# Patient Record
Sex: Female | Born: 1945
Health system: Southern US, Community
[De-identification: ages and names within clinical notes are randomized; demographics above are authoritative.]

## PROBLEM LIST (undated history)

## (undated) DIAGNOSIS — K635 Polyp of colon: Secondary | ICD-10-CM

## (undated) DIAGNOSIS — E119 Type 2 diabetes mellitus without complications: Secondary | ICD-10-CM

## (undated) DIAGNOSIS — R42 Dizziness and giddiness: Secondary | ICD-10-CM

## (undated) DIAGNOSIS — I10 Essential (primary) hypertension: Secondary | ICD-10-CM

## (undated) DIAGNOSIS — I639 Cerebral infarction, unspecified: Secondary | ICD-10-CM

## (undated) DIAGNOSIS — D126 Benign neoplasm of colon, unspecified: Secondary | ICD-10-CM

## (undated) HISTORY — DX: Benign neoplasm of colon, unspecified: D12.6

## (undated) HISTORY — DX: Type 2 diabetes mellitus without complications: E11.9

## (undated) HISTORY — DX: Dizziness and giddiness: R42

## (undated) HISTORY — DX: Cerebral infarction, unspecified: I63.9

## (undated) HISTORY — DX: Polyp of colon: K63.5

---

## 2002-01-05 ENCOUNTER — Ambulatory Visit (HOSPITAL_COMMUNITY): Admission: RE | Admit: 2002-01-05 | Discharge: 2002-01-05 | Payer: Self-pay | Admitting: Family Medicine

## 2002-01-05 ENCOUNTER — Encounter: Payer: Self-pay | Admitting: Family Medicine

## 2003-01-05 ENCOUNTER — Ambulatory Visit (HOSPITAL_COMMUNITY): Admission: RE | Admit: 2003-01-05 | Discharge: 2003-01-05 | Payer: Self-pay | Admitting: Family Medicine

## 2003-01-05 ENCOUNTER — Encounter: Payer: Self-pay | Admitting: Family Medicine

## 2004-01-07 ENCOUNTER — Ambulatory Visit (HOSPITAL_COMMUNITY): Admission: RE | Admit: 2004-01-07 | Discharge: 2004-01-07 | Payer: Self-pay | Admitting: Family Medicine

## 2007-02-23 ENCOUNTER — Ambulatory Visit: Payer: Self-pay | Admitting: Internal Medicine

## 2007-02-23 ENCOUNTER — Ambulatory Visit (HOSPITAL_COMMUNITY): Admission: RE | Admit: 2007-02-23 | Discharge: 2007-02-23 | Payer: Self-pay | Admitting: Internal Medicine

## 2007-02-23 ENCOUNTER — Encounter (INDEPENDENT_AMBULATORY_CARE_PROVIDER_SITE_OTHER): Payer: Self-pay | Admitting: *Deleted

## 2007-02-23 DIAGNOSIS — D126 Benign neoplasm of colon, unspecified: Secondary | ICD-10-CM

## 2007-02-23 DIAGNOSIS — K635 Polyp of colon: Secondary | ICD-10-CM

## 2007-02-23 HISTORY — DX: Benign neoplasm of colon, unspecified: D12.6

## 2007-02-23 HISTORY — PX: COLONOSCOPY: SHX174

## 2007-02-23 HISTORY — DX: Polyp of colon: K63.5

## 2008-09-05 ENCOUNTER — Emergency Department (HOSPITAL_COMMUNITY): Admission: EM | Admit: 2008-09-05 | Discharge: 2008-09-06 | Payer: Self-pay | Admitting: Emergency Medicine

## 2008-09-24 ENCOUNTER — Ambulatory Visit (HOSPITAL_COMMUNITY): Admission: RE | Admit: 2008-09-24 | Discharge: 2008-09-24 | Payer: Self-pay | Admitting: Family Medicine

## 2009-01-20 ENCOUNTER — Emergency Department (HOSPITAL_COMMUNITY): Admission: EM | Admit: 2009-01-20 | Discharge: 2009-01-20 | Payer: Self-pay | Admitting: Emergency Medicine

## 2009-02-12 ENCOUNTER — Encounter: Payer: Self-pay | Admitting: Cardiology

## 2009-02-12 ENCOUNTER — Ambulatory Visit (HOSPITAL_COMMUNITY): Admission: RE | Admit: 2009-02-12 | Discharge: 2009-02-12 | Payer: Self-pay | Admitting: Cardiology

## 2009-02-12 ENCOUNTER — Ambulatory Visit: Payer: Self-pay | Admitting: Cardiology

## 2009-02-14 ENCOUNTER — Ambulatory Visit: Payer: Self-pay | Admitting: Cardiology

## 2010-12-06 ENCOUNTER — Encounter: Payer: Self-pay | Admitting: Family Medicine

## 2011-03-31 NOTE — Assessment & Plan Note (Signed)
[facility]                       [place] [facility]IOLOG[facility] OFFICE NOTE   NAME:[name], Grant                     MRN:          [id number]  DATE:02/12/2009                            DOB:          1946-02-10    CHIEF COMPLAINT:  I had a recent stroke.   HISTORY OF PRESENT ILLNESS:  [name] is a 65 year old married  white female, wife of patient of mine, who comes today referred by Dr.  Patrica Duel with a history of recent cerebellar stroke.   Per her history, on September 05, 2008, while she was cleaning out the  toilet, she got very dizzy and then subsequently had vomiting.  She went  to the emergency room and it was felt that she had an anxiety attack.   She continued to feel bad with dizziness and lightheadedness.  She went  to see Dr. Patrica Duel, who performed MRI of the head without contrast  on September 24, 2008.  This showed a late subacute infarction of left  superior cerebellar artery with no sign of hemorrhage or mass effect or  edema.  She had multiple small vessel changes in the cerebellar  hemisphere white matter.   She was then referred to [facility] to Dr. Felipe Drone, Neurology.  CT  angiography of the neck on October 03, 2008, showed basically no  significant obstructive disease in the vertebral arteries which were  both widely patent, there was no sign of dissection, and both vessels  were patent to the basilar artery.  Both common carotid arteries were  widely patent as well to the respective bifurcations.  Both cervical  internal carotid arteries also appeared normal.  Proximal anterior  circulation also was normal.  There was no comment made about the  cerebellum and no comment about the posterior cerebral circulation.   Her persistent symptoms now are left hand intention tremor.  She has a  little bit of a tremor at rest as well.  Her balance is improved.   Her cardiac and cerebrovascular risk factors include  hypertension and  borderline diabetes.  Her lipid status has been good according to her  and recent blood work from Dr. [name] office showed a total  cholesterol 102, HDL 54, and LDL 39.  I do not see whether she was on a  medication at that time.   At present, she has had no tachypalpitations though her heart rate tends  to run a little fast.  Previous EKG shows sinus tachycardia.  There is  no history of atrial arrhythmias.  She has had no syncope, dyspnea on  exertion, or angina.   PAST MEDICAL HISTORY:  Other than the above, she has had no  hospitalizations.  She is allergic to AMOXICILLIN.   CURRENT MEDICATIONS:  1. Lisinopril 5 mg a day.  2. Metformin extended release 500 mg a day.  3. Aspirin 325 mg a day.   SOCIAL HISTORY:  She is retired.  She is married, has no children.  Her  husband is with her today.   FAMILY HISTORY:  Remarkable for heart issues in her father, not  specified.  REVIEW OF SYSTEMS:  She wears eyeglasses.  She does not have dentures.  She has no other real positive review of systems other than the HPI.  All systems were questioned.   PHYSICAL EXAMINATION:  VITAL SIGNS:  Her blood pressure is 140/78 in the  right arm.  Her pulse is 100 and regular.  EKG confirms sinus  tachycardia.  Weight is 173.  HEENT:  Normal.  Facial symmetry is normal.  Teeth are in good shape.  Mucosa is normal.  NECK:  Supple.  Carotid upstrokes were equal bilaterally without bruits.  There is no JVD.  Thyroid is not enlarged.  Trachea is midline.  LUNGS:  Clear to auscultation and percussion.  HEART:  A nondisplaced PMI.  There is a regular rate and rhythm.  No  murmur, rub, or gallop.  ABDOMEN:  Soft, good bowel sounds.  No pulsatile mass.  No tenderness.  No organomegaly.  EXTREMITIES:  No cyanosis, clubbing, or edema.  Pulses are intact.  NEUROLOGIC:  Intact other than the intention tremor that is worse with  her palm faced to her nose with finger-to-nose.   EKG  shows a lot of tremor artifact, but basically shows normal sinus  rhythm with normal PR, QRS, and QTc interval.  She has somewhat low  voltage.   ASSESSMENT:  Recent left cerebellar stroke.  By CT angiography, there  has not been any major atherosclerotic disease detected other than some  small vessel changes.  Her vertebrobasilar system as well as her carotid  artery system seems to be intact.  There was no sign of dissection as  well that leaves embolism as a possible etiology.   There has been no history of any arrhythmias and certainly no symptoms  of such.  She does have a history of hypertension and probably has some  nonobstructive plaque in her cerebral circulation.  This could be the  cause of embolus as well.   PLAN:  1. Begin metoprolol 50 mg of the extended release p.o. daily to      decrease her heart rate and improve her blood pressure.  2. Continue aspirin and lisinopril.  3. Tight blood sugar control.  4. A 2-D echocardiogram.  If this shows any predisposition for atrial      fib or clot, we will perform a TEE.   Utility of placing a monitor at this point in time is pretty low.   I will see her back in 2 days after she has had her echocardiogram to  discuss further.     Thomas C. Daleen Squibb, MD, [facility]  Electronically Signed    TCW/MedQ  DD: 02/12/2009  DT: 02/13/2009  Job #: [id number]   cc:   Patrica Duel, M.D.

## 2011-03-31 NOTE — Assessment & Plan Note (Signed)
[facility]                       [place] CARDIOLOGY OFFICE NOTE   NAME:STANLEYMarlynn, Hinckley                     MRN:          [id number]  DATE:02/14/2009                            DOB:          [birth year]    [name] returns today for followup of her recent stroke.   Please see my extensive note from February 12, 2009.   A 2-D echocardiogram showed normal left ventricular function, EF of 60%,  no wall motion abnormalities, mild LVH.  No valvular heart disease.  No  obvious cardiac source of embolus as well as a normal left atrial size.   I have received the office notes now from Dr. Ninetta Lights.  He felt this was  ischemic stroke and secondary stroke prevention was indicated with  aspirin and blood pressure control.   She is tolerating the metoprolol 50 mg p.o. quite nicely.  She is on  aspirin 325 a day, metformin extended release 500 mg a day, and  lisinopril 5 mg a day.   Her blood pressure is 132/80, her pulse is 76 and regular.  Her weight  is 174.  The rest of her exam is unchanged.   ASSESSMENT AND PLAN:  I had a long talk with Mr. and Ms. Treese today.  At this point, aspirin 325 mg is indicated.  I told them that blood  pressure control and heart rate control were also essential.  If she  begins to have any significant tachy palpitations or palpitations in  general, she will let me know.  Otherwise, she will follow with Dr.  Nobie Putnam and Dr. Ninetta Lights.     Thomas C. Daleen Squibb, MD, [facility]  Electronically Signed    TCW/MedQ  DD: 02/14/2009  DT: 02/14/2009  Job #: [id number]   cc:   Felipe Drone, MD  Patrica Duel, M.D.

## 2011-04-03 NOTE — Op Note (Signed)
NAMESHERMAINE, Nina Grant              ACCOUNT NO.:  [id number]   MEDICAL RECORD NO.:  [id number]          PATIENT TYPE:  AMB   LOCATION:  DAY                           FACILITY:  [facility]   PHYSICIAN:  R. Roetta Sessions, M.D. DATE OF BIRTH:  03/29/1946   DATE OF PROCEDURE:  02/23/2007  DATE OF DISCHARGE:                               OPERATIVE REPORT   PROCEDURE:  Colonoscopy, biopsy.   INDICATIONS FOR PROCEDURE:  The patient is a 61-year lady with no lower  GI tract symptoms who has positive family history of colon cancer in a  sister who succumbed to the disease after being diagnosed at age 39.  [name] has never had her lower GI tract imaged.  Colonoscopy is now  being done as a high-risk screening maneuver.  This approach discussed  with the patient at length.  Potential risks, benefits and alternatives  have been reviewed, questions answered.  She is agreeable.  Please see  documentation in the medical record.   PROCEDURE NOTE:  O2 saturation, blood pressure, pulse, respirations were  monitored the entire procedure.   CONSCIOUS SEDATION:  Versed 4 mg IV, Demerol 100 grams IV in divided  doses.   INSTRUMENT:  Pentax video chip system.   FINDINGS:  Digital rectal exam revealed no abnormalities.   ENDOSCOPIC FINDINGS:  The prep was good.  Examination of colonic mucosa  was undertaken from the rectosigmoid junction and left, transverse,  right colon to area appendiceal orifice, ileocecal valve and cecum.  These structures well seen photographed for the record.  From this level  scope was withdrawn.  All previous mentioned mucosal surfaces were again  seen.  The patient had a 4 mm polyp in the mid descending colon and a  second similar sized polyp in the mid sigmoid.  Both of these polyps  were cold biopsy/removed.  The patient had left-sided diverticula,  remainder of colonic mucosa appeared normal.  The scope was pulled down  to the rectum where thorough examination the rectal  mucosa including  retroflex view of anal verge demonstrated only some anal papilla.  The  patient tolerated the procedure well was reacted endoscopy.   IMPRESSION:  1. Anal papilla, otherwise normal rectum.  2. Left-sided diverticula diminutive polyps in the mid descending and      sigmoid colon cold biopsy/removed.  Remainder of colonic appeared      normal.   RECOMMENDATIONS:  1. Diverticulosis and polyp literature provided to Ms. Serano  2. Follow-up on path.  3. Further recommendations to follow.      Jonathon Bellows, M.D.  Electronically Signed     RMR/MEDQ  D:  02/23/2007  T:  02/23/2007  Job:  [id number]   cc:   Patrica Duel, M.D.  Fax: [phone]

## 2011-08-18 LAB — URINALYSIS, ROUTINE W REFLEX MICROSCOPIC
Glucose, UA: NEGATIVE
Hgb urine dipstick: NEGATIVE
Protein, ur: NEGATIVE
Specific Gravity, Urine: >1.03 — ABNORMAL HIGH

## 2011-08-18 LAB — DIFFERENTIAL
Lymphocytes Relative: 10 — ABNORMAL LOW
Lymphs Abs: 0.9
Monocytes Relative: 3
Neutro Abs: 7.9 — ABNORMAL HIGH
Neutrophils Relative %: 87 — ABNORMAL HIGH

## 2011-08-18 LAB — CBC
Hemoglobin: 13.6
MCHC: 33.8
MCV: 91
RDW: 12.7

## 2011-08-18 LAB — COMPREHENSIVE METABOLIC PANEL
ALT: 20
BUN: 10
CO2: 25
Calcium: 8.9
Creatinine, Ser: 0.74
GFR calc non Af Amer: >60
Glucose, Bld: 206 — ABNORMAL HIGH
Sodium: 140
Total Protein: 6.3

## 2011-11-12 ENCOUNTER — Encounter: Payer: Self-pay | Admitting: Cardiology

## 2012-02-11 ENCOUNTER — Encounter: Payer: Self-pay | Admitting: Internal Medicine

## 2012-03-29 ENCOUNTER — Encounter: Payer: Self-pay | Admitting: Internal Medicine

## 2012-03-29 ENCOUNTER — Ambulatory Visit (INDEPENDENT_AMBULATORY_CARE_PROVIDER_SITE_OTHER): Payer: Medicare Other | Admitting: Internal Medicine

## 2012-03-29 VITALS — BP 136/72 | HR 71 | Temp 98.4°F | Ht 63.0 in | Wt 176.0 lb

## 2012-03-29 DIAGNOSIS — Z86010 Personal history of colonic polyps: Secondary | ICD-10-CM

## 2012-03-29 DIAGNOSIS — Z800 Family history of malignant neoplasm of digestive organs: Secondary | ICD-10-CM

## 2012-03-29 DIAGNOSIS — K635 Polyp of colon: Secondary | ICD-10-CM | POA: Insufficient documentation

## 2012-03-29 DIAGNOSIS — D126 Benign neoplasm of colon, unspecified: Secondary | ICD-10-CM

## 2012-03-29 NOTE — Assessment & Plan Note (Signed)
Pleasant 66 year old lady returns for scheduled for surveillance colonoscopy. Prior history of colonic adenoma and a positive family history of colon cancer in a first-degree relative. She's had an interim CVA with fairly good recovery.  Recommendations:The risks, benefits, limitations, alternatives and imponderables have been reviewed with the patient. Questions have been answered. All parties are agreeable.

## 2012-03-29 NOTE — Patient Instructions (Addendum)
Will schedule a colonoscopy to follow-up on polyps  Do not take your evening dose of metformin the night before the procedure

## 2012-03-29 NOTE — Progress Notes (Signed)
Primary Care Physician:  No primary provider on file. Primary Gastroenterologist:  Dr. Kortlyn Koltz  Pre-Procedure History & Physical: HPI:  Nina Grant is a 66 y.o. female here for consideration of setting up a surveillance colonoscopy. First colonoscopy in 2000 demonstrated diverticulosis and small adenomatous polyp. Positive family history of colon cancer and her sister. Currently does not have any bowel symptoms. Had a stroke in 2009. Residual mild weakness on the left side; she ambulates with a walker. No melena, rectal bleeding or other bowel symptoms. She is due for surveillance colonoscopy at this time. She has been found to be diabetic and hypertensive over the past few years.  Past Medical History  Diagnosis Date  . DM (diabetes mellitus)   . Vertigo   . Stroke   . Diverticula of colon   . Tubular adenoma of colon 02/23/2007  . Hyperplastic colon polyp 02/23/2007    Past Surgical History  Procedure Date  . Colonoscopy 02/23/2007    Dr. Marcanthony Sleight- tubular adenoma, hyperplastic polyp, diverticula    Prior to Admission medications   Medication Sig Start Date End Date Taking? Authorizing Provider  aspirin 325 MG tablet Take 325 mg by mouth daily.   Yes Historical Provider, MD  metFORMIN (GLUCOPHAGE-XR) 500 MG 24 hr tablet Take 500 mg by mouth at bedtime.  03/01/12  Yes Historical Provider, MD  metoprolol tartrate (LOPRESSOR) 25 MG tablet Take 25 mg by mouth daily.  03/01/12  Yes Historical Provider, MD    Allergies as of 03/29/2012  . (Not on File)    Family History  Problem Relation Age of Onset  . Colon cancer Sister 70    History   Social History  . Marital Status: Married    Spouse Name: N/A    Number of Children: N/A  . Years of Education: N/A   Occupational History  . Not on file.   Social History Main Topics  . Smoking status: Never Smoker   . Smokeless tobacco: Not on file  . Alcohol Use: No  . Drug Use: No  . Sexually Active: Not on file   Other Topics Concern   . Not on file   Social History Narrative  . No narrative on file    Review of Systems: See HPI, otherwise negative ROS  Physical Exam: BP 136/72  Pulse 71  Temp(Src) 98.4 F (36.9 C) (Temporal)  Ht 5' 3" (1.6 m)  Wt 176 lb (79.833 kg)  BMI 31.18 kg/m2 General:   Alert,  Well-developed, well-nourished, pleasant and cooperative in NAD.  We: Left sided ambulates with a cane. Patient examined in the chair because of patient convenience Skin:  Intact without significant lesions or rashes. Eyes:  Sclera clear, no icterus.   Conjunctiva pink. Ears:  Normal auditory acuity. Nose:  No deformity, discharge,  or lesions. Mouth:  No deformity or lesions. Neck:  Supple; no masses or thyromegaly. No significant cervical adenopathy. Lungs:  Clear throughout to auscultation.   No wheezes, crackles, or rhonchi. No acute distress. Heart:  Regular rate and rhythm; no murmurs, clicks, rubs,  or gallops. Abdomen: Non-distended, normal bowel sounds.  Soft and nontender without appreciable mass or hepatosplenomegaly.  Pulses:  Normal pulses noted. Extremities:  Without clubbing or edema.  Weak left side  

## 2012-03-29 NOTE — Progress Notes (Signed)
Primary Care Physician:  [facility] Primary Gastroenterologist:  Dr. Jena Gauss Pre-Procedure History & Physical: HPI:  Nina Grant is a 66 y.o. female here for   Past Medical History  Diagnosis Date  . DM (diabetes mellitus)   . Vertigo   . Stroke   . Diverticula of colon   . Tubular adenoma of colon 02/23/2007  . Hyperplastic colon polyp 02/23/2007    Past Surgical History  Procedure Date  . Colonoscopy 02/23/2007    Dr. Jena Gauss- tubular adenoma, hyperplastic polyp, diverticula    Prior to Admission medications   Medication Sig Start Date End Date Taking? Authorizing Provider  aspirin 325 MG tablet Take 325 mg by mouth daily.   Yes Historical Provider, MD  metFORMIN (GLUCOPHAGE-XR) 500 MG 24 hr tablet Take 500 mg by mouth at bedtime.  03/01/12  Yes Historical Provider, MD  metoprolol tartrate (LOPRESSOR) 25 MG tablet Take 25 mg by mouth daily.  03/01/12  Yes Historical Provider, MD    Allergies as of 03/29/2012  . (Not on File)    Family History  Problem Relation Age of Onset  . Colon cancer Sister 80    History   Social History  . Marital Status: Married    Spouse Name: N/A    Number of Children: N/A  . Years of Education: N/A   Occupational History  . Not on file.   Social History Main Topics  . Smoking status: Never Smoker   . Smokeless tobacco: Not on file  . Alcohol Use: No  . Drug Use: No  . Sexually Active: Not on file   Other Topics Concern  . Not on file   Social History Narrative  . No narrative on file    Review of Systems: See HPI, otherwise negative ROS  Physical Exam: BP 136/72  Pulse 71  Temp(Src) 98.4 F (36.9 C) (Temporal)  Ht 5\' 3"  (1.6 m)  Wt 176 lb (79.833 kg)  BMI 31.18 kg/m2 General:   Alert,  Well-developed, well-nourished, pleasant and cooperative in NAD Skin:  Intact without significant lesions or rashes. Eyes:  Sclera clear, no icterus.   Conjunctiva pink. Ears:  Normal auditory acuity. Nose:  No deformity, discharge,  or  lesions. Mouth:  No deformity or lesions. Neck:  Supple; no masses or thyromegaly. No significant cervical adenopathy. Lungs:  Clear throughout to auscultation.   No wheezes, crackles, or rhonchi. No acute distress. Heart:  Regular rate and rhythm; no murmurs, clicks, rubs,  or gallops. Abdomen: Non-distended, normal bowel sounds.  Soft and nontender without appreciable mass or hepatosplenomegaly.  Pulses:  Normal pulses noted. Extremities:  Without clubbing or edema.

## 2012-03-30 ENCOUNTER — Encounter: Payer: Self-pay | Admitting: Internal Medicine

## 2012-04-25 ENCOUNTER — Encounter (HOSPITAL_COMMUNITY): Payer: Self-pay | Admitting: Pharmacy Technician

## 2012-04-27 MED ORDER — SODIUM CHLORIDE 0.45 % IV SOLN
Freq: Once | INTRAVENOUS | Status: AC
  Administered 2012-04-28: 08:00:00 via INTRAVENOUS

## 2012-04-28 ENCOUNTER — Encounter (HOSPITAL_COMMUNITY): Payer: Self-pay | Admitting: *Deleted

## 2012-04-28 ENCOUNTER — Encounter (HOSPITAL_COMMUNITY)
Admission: RE | Disposition: A | Discharge status: Home / Self Care | Payer: Self-pay | Source: Ambulatory Visit | Attending: Internal Medicine

## 2012-04-28 ENCOUNTER — Ambulatory Visit (HOSPITAL_COMMUNITY)
Admission: RE | Admit: 2012-04-28 | Discharge: 2012-04-28 | Disposition: A | Discharge status: Home / Self Care | Payer: Medicare Other | Source: Ambulatory Visit | Attending: Internal Medicine | Admitting: Internal Medicine

## 2012-04-28 DIAGNOSIS — Z860100 Personal history of colon polyps, unspecified: Secondary | ICD-10-CM | POA: Insufficient documentation

## 2012-04-28 DIAGNOSIS — E119 Type 2 diabetes mellitus without complications: Secondary | ICD-10-CM | POA: Insufficient documentation

## 2012-04-28 DIAGNOSIS — Z01812 Encounter for preprocedural laboratory examination: Secondary | ICD-10-CM | POA: Insufficient documentation

## 2012-04-28 DIAGNOSIS — K5730 Diverticulosis of large intestine without perforation or abscess without bleeding: Secondary | ICD-10-CM

## 2012-04-28 DIAGNOSIS — Z09 Encounter for follow-up examination after completed treatment for conditions other than malignant neoplasm: Secondary | ICD-10-CM | POA: Insufficient documentation

## 2012-04-28 DIAGNOSIS — K635 Polyp of colon: Secondary | ICD-10-CM

## 2012-04-28 DIAGNOSIS — Z800 Family history of malignant neoplasm of digestive organs: Secondary | ICD-10-CM

## 2012-04-28 DIAGNOSIS — Z1211 Encounter for screening for malignant neoplasm of colon: Secondary | ICD-10-CM

## 2012-04-28 HISTORY — PX: COLONOSCOPY: SHX5424

## 2012-04-28 HISTORY — DX: Essential (primary) hypertension: I10

## 2012-04-28 SURGERY — COLONOSCOPY
Anesthesia: Moderate Sedation

## 2012-04-28 MED ORDER — MIDAZOLAM HCL 5 MG/5ML IJ SOLN
INTRAMUSCULAR | Status: AC
  Filled 2012-04-28: qty 10

## 2012-04-28 MED ORDER — MEPERIDINE HCL 100 MG/ML IJ SOLN
INTRAMUSCULAR | Status: AC
  Filled 2012-04-28: qty 2

## 2012-04-28 MED ORDER — ONDANSETRON HCL 4 MG/2ML IJ SOLN
INTRAMUSCULAR | Status: AC
  Administered 2012-04-28: 4 mg via INTRAVENOUS
  Filled 2012-04-28: qty 2

## 2012-04-28 MED ORDER — STERILE WATER FOR IRRIGATION IR SOLN
Status: DC | PRN
  Administered 2012-04-28: 08:00:00

## 2012-04-28 MED ORDER — MIDAZOLAM HCL 5 MG/5ML IJ SOLN
INTRAMUSCULAR | Status: DC | PRN
  Administered 2012-04-28 (×3): 1 mg via INTRAVENOUS
  Administered 2012-04-28: 2 mg via INTRAVENOUS

## 2012-04-28 MED ORDER — MEPERIDINE HCL 100 MG/ML IJ SOLN
INTRAMUSCULAR | Status: DC | PRN
  Administered 2012-04-28: 25 mg via INTRAVENOUS
  Administered 2012-04-28: 50 mg via INTRAVENOUS
  Administered 2012-04-28: 25 mg via INTRAVENOUS

## 2012-04-28 MED ORDER — ONDANSETRON HCL 4 MG/2ML IJ SOLN
4.0000 mg | Freq: Once | INTRAMUSCULAR | Status: AC
  Administered 2012-04-28: 4 mg via INTRAVENOUS

## 2012-04-28 NOTE — Discharge Instructions (Addendum)
Colonoscopy Discharge Instructions  Read the instructions outlined below and refer to this sheet in the next few weeks. These discharge instructions provide you with general information on caring for yourself after you leave the hospital. Your doctor may also give you specific instructions. While your treatment has been planned according to the most current medical practices available, unavoidable complications occasionally occur. If you have any problems or questions after discharge, call Dr. Jena Gauss at [phone]. ACTIVITY  You may resume your regular activity, but move at a slower pace for the next 24 hours.   Take frequent rest periods for the next 24 hours.   Walking will help get rid of the air and reduce the bloated feeling in your belly (abdomen).   No driving for 24 hours (because of the medicine (anesthesia) used during the test).    Do not sign any important legal documents or operate any machinery for 24 hours (because of the anesthesia used during the test).  NUTRITION  Drink plenty of fluids.   You may resume your normal diet as instructed by your doctor.   Begin with a light meal and progress to your normal diet. Heavy or fried foods are harder to digest and may make you feel sick to your stomach (nauseated).   Avoid alcoholic beverages for 24 hours or as instructed.  MEDICATIONS  You may resume your normal medications unless your doctor tells you otherwise.  WHAT YOU CAN EXPECT TODAY  Some feelings of bloating in the abdomen.   Passage of more gas than usual.   Spotting of blood in your stool or on the toilet paper.  IF YOU HAD POLYPS REMOVED DURING THE COLONOSCOPY:  No aspirin products for 7 days or as instructed.   No alcohol for 7 days or as instructed.   Eat a soft diet for the next 24 hours.  FINDING OUT THE RESULTS OF YOUR TEST Not all test results are available during your visit. If your test results are not back during the visit, make an appointment  with your caregiver to find out the results. Do not assume everything is normal if you have not heard from your caregiver or the medical facility. It is important for you to follow up on all of your test results.  SEEK IMMEDIATE MEDICAL ATTENTION IF:  You have more than a spotting of blood in your stool.   Your belly is swollen (abdominal distention).   You are nauseated or vomiting.   You have a temperature over 101.   You have abdominal pain or discomfort that is severe or gets worse throughout the day.      Diverticulosis information provided.  Repeat colonoscopy in 5 years.   Diverticulosis Diverticulosis is a common condition that develops when small pouches (diverticula) form in the wall of the colon. The risk of diverticulosis increases with age. It happens more often in people who eat a low-fiber diet. Most individuals with diverticulosis have no symptoms. Those individuals with symptoms usually experience abdominal pain, constipation, or loose stools (diarrhea). HOME CARE INSTRUCTIONS  Increase the amount of fiber in your diet as directed by your caregiver or dietician. This may reduce symptoms of diverticulosis.  Your caregiver may recommend taking a dietary fiber supplement.  Drink at least 6 to 8 glasses of water each day to prevent constipation.  Try not to strain when you have a bowel movement.  Your caregiver may recommend avoiding nuts and seeds to prevent complications, although this is still an uncertain benefit.  Only take over-the-counter or prescription medicines for pain, discomfort, or fever as directed by your caregiver.  FOODS WITH HIGH FIBER CONTENT INCLUDE: Fruits. Apple, peach, pear, tangerine, raisins, prunes.  Vegetables. Brussels sprouts, asparagus, broccoli, cabbage, carrot, cauliflower, romaine lettuce, spinach, summer squash, tomato, winter squash, zucchini.  Starchy Vegetables. Baked beans, kidney beans, lima beans, split peas, lentils, potatoes  (with skin).  Grains. Whole wheat bread, brown rice, bran flake cereal, plain oatmeal, white rice, shredded wheat, bran muffins.  SEEK IMMEDIATE MEDICAL CARE IF:  You develop increasing pain or severe bloating.  You have an oral temperature above 102 F (38.9 C), not controlled by medicine.  You develop vomiting or bowel movements that are bloody or black.  Document Released: 07/30/2004 Document Revised: 10/22/2011 Document Reviewed: 04/02/2010 [facility] Patient Information 2012 [place], [place].

## 2012-04-28 NOTE — Interval H&P Note (Signed)
History and Physical Interval Note:  04/28/2012 8:30 AM  Nina Grant  has presented today for surgery, with the diagnosis of hx of polyps  The various methods of treatment have been discussed with the patient and family. After consideration of risks, benefits and other options for treatment, the patient has consented to  Procedure(s) (LRB): COLONOSCOPY (N/A) as a surgical intervention .  The patients' history has been reviewed, patient examined, no change in status, stable for surgery.  I have reviewed the patients' chart and labs.  Questions were answered to the patient's satisfaction.     Eula Listen

## 2012-04-28 NOTE — H&P (View-Only) (Signed)
Primary Care Physician:  No primary provider on file. Primary Gastroenterologist:  Dr. Jena Gauss  Pre-Procedure History & Physical: HPI:  Nina Grant is a 66 y.o. female here for consideration of setting up a surveillance colonoscopy. First colonoscopy in 2000 demonstrated diverticulosis and small adenomatous polyp. Positive family history of colon cancer and her sister. Currently does not have any bowel symptoms. Had a stroke in 2009. Residual mild weakness on the left side; she ambulates with a walker. No melena, rectal bleeding or other bowel symptoms. She is due for surveillance colonoscopy at this time. She has been found to be diabetic and hypertensive over the past few years.  Past Medical History  Diagnosis Date  . DM (diabetes mellitus)   . Vertigo   . Stroke   . Diverticula of colon   . Tubular adenoma of colon 02/23/2007  . Hyperplastic colon polyp 02/23/2007    Past Surgical History  Procedure Date  . Colonoscopy 02/23/2007    Dr. Jena Gauss- tubular adenoma, hyperplastic polyp, diverticula    Prior to Admission medications   Medication Sig Start Date End Date Taking? Authorizing Provider  aspirin 325 MG tablet Take 325 mg by mouth daily.   Yes Historical Provider, MD  metFORMIN (GLUCOPHAGE-XR) 500 MG 24 hr tablet Take 500 mg by mouth at bedtime.  03/01/12  Yes Historical Provider, MD  metoprolol tartrate (LOPRESSOR) 25 MG tablet Take 25 mg by mouth daily.  03/01/12  Yes Historical Provider, MD    Allergies as of 03/29/2012  . (Not on File)    Family History  Problem Relation Age of Onset  . Colon cancer Sister 71    History   Social History  . Marital Status: Married    Spouse Name: N/A    Number of Children: N/A  . Years of Education: N/A   Occupational History  . Not on file.   Social History Main Topics  . Smoking status: Never Smoker   . Smokeless tobacco: Not on file  . Alcohol Use: No  . Drug Use: No  . Sexually Active: Not on file   Other Topics Concern   . Not on file   Social History Narrative  . No narrative on file    Review of Systems: See HPI, otherwise negative ROS  Physical Exam: BP 136/72  Pulse 71  Temp(Src) 98.4 F (36.9 C) (Temporal)  Ht 5\' 3"  (1.6 m)  Wt 176 lb (79.833 kg)  BMI 31.18 kg/m2 General:   Alert,  Well-developed, well-nourished, pleasant and cooperative in NAD.  We: Left sided ambulates with a cane. Patient examined in the chair because of patient convenience Skin:  Intact without significant lesions or rashes. Eyes:  Sclera clear, no icterus.   Conjunctiva pink. Ears:  Normal auditory acuity. Nose:  No deformity, discharge,  or lesions. Mouth:  No deformity or lesions. Neck:  Supple; no masses or thyromegaly. No significant cervical adenopathy. Lungs:  Clear throughout to auscultation.   No wheezes, crackles, or rhonchi. No acute distress. Heart:  Regular rate and rhythm; no murmurs, clicks, rubs,  or gallops. Abdomen: Non-distended, normal bowel sounds.  Soft and nontender without appreciable mass or hepatosplenomegaly.  Pulses:  Normal pulses noted. Extremities:  Without clubbing or edema.  Weak left side

## 2012-04-28 NOTE — Op Note (Signed)
[facility] [address] [place], [place]  [address]  COLONOSCOPY PROCEDURE REPORT  PATIENT:  Nina Grant, Nina Grant  MR#:  [id number] BIRTHDATE:  February 04, 1946, 66 yrs. old  GENDER:  female ENDOSCOPIST:  R. Roetta Sessions, MD FACP [facility] REF. BY:          self PROCEDURE DATE:  04/28/2012 PROCEDURE:  Surveillance colonoscopy  INDICATIONS:  History of colonic adenoma  INFORMED CONSENT:  The risks, benefits, alternatives and imponderables including but not limited to bleeding, perforation as well as the possibility of a missed lesion have been reviewed. The potential for biopsy, lesion removal, etc. have also been discussed.  Questions have been answered.  All parties agreeable. Please see the history and physical in the medical record for more information.  MEDICATIONS:  Versed 5 mg IV and Demerol 100 mg IV in divided doses.  DESCRIPTION OF PROCEDURE:  After a digital rectal exam was performed, the EC-[id number] ([id number]) colonoscope was advanced from the anus through the rectum and colon to the area of the cecum, ileocecal valve and appendiceal orifice.  The cecum was deeply intubated.  These structures were well-seen and photographed for the record.  From the level of the cecum and ileocecal valve, the scope was slowly and cautiously withdrawn.  The mucosal surfaces were carefully surveyed utilizing scope tip deflection to facilitate fold flattening as needed.  The scope was pulled down into the rectum where a thorough examination including retroflexion was performed. <<PROCEDUREIMAGES>>  FINDINGS: Adequate preparation. Anal papilla; otherwise, normal rectum. Scattered left-sided diverticula; remainder of colonic mucosa appeared                      normal.  THERAPEUTIC / DIAGNOSTIC MANEUVERS PERFORMED: None  COMPLICATIONS:  None  CECAL WITHDRAWAL TIME: 8 minutes  IMPRESSION: Colonic diverticulosis  RECOMMENDATIONS: Repeat surveillance colonoscopy in 5  years  ______________________________ R. Roetta Sessions, MD Caleen Essex  CC:  n. eSIGNED:   R. Roetta Sessions at 04/28/2012 09:06 AM  Marily Lente, [phone]

## 2012-05-03 ENCOUNTER — Encounter (HOSPITAL_COMMUNITY): Payer: Self-pay | Admitting: Internal Medicine

## 2012-12-27 ENCOUNTER — Other Ambulatory Visit (HOSPITAL_COMMUNITY): Payer: Self-pay | Admitting: Family Medicine

## 2012-12-27 DIAGNOSIS — Z139 Encounter for screening, unspecified: Secondary | ICD-10-CM

## 2013-01-02 ENCOUNTER — Ambulatory Visit (HOSPITAL_COMMUNITY)
Admission: RE | Admit: 2013-01-02 | Discharge: 2013-01-02 | Disposition: A | Discharge status: Home / Self Care | Payer: Medicare Other | Source: Ambulatory Visit | Attending: Family Medicine | Admitting: Family Medicine

## 2013-01-02 DIAGNOSIS — Z780 Asymptomatic menopausal state: Secondary | ICD-10-CM | POA: Insufficient documentation

## 2013-01-02 DIAGNOSIS — Z13820 Encounter for screening for osteoporosis: Secondary | ICD-10-CM | POA: Insufficient documentation

## 2013-01-02 DIAGNOSIS — Z139 Encounter for screening, unspecified: Secondary | ICD-10-CM

## 2013-04-12 ENCOUNTER — Other Ambulatory Visit (HOSPITAL_COMMUNITY): Payer: Self-pay | Admitting: Family Medicine

## 2013-04-12 DIAGNOSIS — Z139 Encounter for screening, unspecified: Secondary | ICD-10-CM

## 2013-04-18 ENCOUNTER — Ambulatory Visit (HOSPITAL_COMMUNITY)
Admission: RE | Admit: 2013-04-18 | Discharge: 2013-04-18 | Disposition: A | Discharge status: Home / Self Care | Payer: Medicare Other | Source: Ambulatory Visit | Attending: Family Medicine | Admitting: Family Medicine

## 2013-04-18 DIAGNOSIS — Z1231 Encounter for screening mammogram for malignant neoplasm of breast: Secondary | ICD-10-CM | POA: Insufficient documentation

## 2013-04-18 DIAGNOSIS — Z139 Encounter for screening, unspecified: Secondary | ICD-10-CM

## 2015-06-26 ENCOUNTER — Other Ambulatory Visit (HOSPITAL_COMMUNITY): Payer: Self-pay | Admitting: Internal Medicine

## 2015-06-26 DIAGNOSIS — Z1231 Encounter for screening mammogram for malignant neoplasm of breast: Secondary | ICD-10-CM

## 2015-07-03 ENCOUNTER — Ambulatory Visit (HOSPITAL_COMMUNITY): Payer: Self-pay

## 2015-07-03 ENCOUNTER — Ambulatory Visit (HOSPITAL_COMMUNITY)
Admission: RE | Admit: 2015-07-03 | Discharge: 2015-07-03 | Disposition: A | Discharge status: Home / Self Care | Payer: Medicare Other | Source: Ambulatory Visit | Attending: Internal Medicine | Admitting: Internal Medicine

## 2015-07-03 ENCOUNTER — Other Ambulatory Visit (HOSPITAL_COMMUNITY): Payer: Self-pay | Admitting: Internal Medicine

## 2015-07-03 DIAGNOSIS — Z1231 Encounter for screening mammogram for malignant neoplasm of breast: Secondary | ICD-10-CM | POA: Diagnosis not present

## 2016-10-23 ENCOUNTER — Ambulatory Visit (HOSPITAL_COMMUNITY)
Admission: RE | Admit: 2016-10-23 | Discharge: 2016-10-23 | Disposition: A | Discharge status: Home / Self Care | Payer: Medicare Other | Source: Ambulatory Visit | Attending: Family Medicine | Admitting: Family Medicine

## 2016-10-23 ENCOUNTER — Other Ambulatory Visit (HOSPITAL_COMMUNITY): Payer: Self-pay | Admitting: Family Medicine

## 2016-10-23 DIAGNOSIS — X58XXXA Exposure to other specified factors, initial encounter: Secondary | ICD-10-CM | POA: Insufficient documentation

## 2016-10-23 DIAGNOSIS — G8929 Other chronic pain: Secondary | ICD-10-CM

## 2016-10-23 DIAGNOSIS — M25512 Pain in left shoulder: Secondary | ICD-10-CM | POA: Diagnosis present

## 2016-10-23 DIAGNOSIS — S42255A Nondisplaced fracture of greater tuberosity of left humerus, initial encounter for closed fracture: Secondary | ICD-10-CM | POA: Insufficient documentation

## 2016-12-09 DIAGNOSIS — M25512 Pain in left shoulder: Secondary | ICD-10-CM | POA: Diagnosis not present

## 2016-12-14 DIAGNOSIS — E1129 Type 2 diabetes mellitus with other diabetic kidney complication: Secondary | ICD-10-CM | POA: Diagnosis not present

## 2016-12-14 DIAGNOSIS — J329 Chronic sinusitis, unspecified: Secondary | ICD-10-CM | POA: Diagnosis not present

## 2016-12-14 DIAGNOSIS — J042 Acute laryngotracheitis: Secondary | ICD-10-CM | POA: Diagnosis not present

## 2016-12-14 DIAGNOSIS — J9801 Acute bronchospasm: Secondary | ICD-10-CM | POA: Diagnosis not present

## 2016-12-14 DIAGNOSIS — Z23 Encounter for immunization: Secondary | ICD-10-CM | POA: Diagnosis not present

## 2016-12-14 DIAGNOSIS — E663 Overweight: Secondary | ICD-10-CM | POA: Diagnosis not present

## 2016-12-14 DIAGNOSIS — Z6829 Body mass index (BMI) 29.0-29.9, adult: Secondary | ICD-10-CM | POA: Diagnosis not present

## 2016-12-14 DIAGNOSIS — Z1389 Encounter for screening for other disorder: Secondary | ICD-10-CM | POA: Diagnosis not present

## 2016-12-17 ENCOUNTER — Ambulatory Visit (HOSPITAL_COMMUNITY): Payer: PPO | Attending: Orthopedic Surgery | Admitting: Occupational Therapy

## 2016-12-17 ENCOUNTER — Encounter (HOSPITAL_COMMUNITY): Payer: Self-pay | Admitting: Occupational Therapy

## 2016-12-17 DIAGNOSIS — M25612 Stiffness of left shoulder, not elsewhere classified: Secondary | ICD-10-CM | POA: Insufficient documentation

## 2016-12-17 DIAGNOSIS — M25512 Pain in left shoulder: Secondary | ICD-10-CM | POA: Insufficient documentation

## 2016-12-17 DIAGNOSIS — R29898 Other symptoms and signs involving the musculoskeletal system: Secondary | ICD-10-CM | POA: Insufficient documentation

## 2016-12-17 NOTE — Therapy (Signed)
[place], [place], [address] Phone: [phone]   Fax:  [phone]  Occupational Therapy Evaluation  Patient Details  Name: Nina Grant MRN: [id number] Date of Birth: [birth year] Referring Provider: Dr. Edmonia Lynch  Encounter Date: 12/17/2016      OT End of Session - 12/17/16 1452    Visit Number 1   Number of Visits 8   Date for OT Re-Evaluation 01/16/17   Authorization Type BCBS Medicare   Authorization Time Period Before 10th visit; $15 copay   Authorization - Visit Number 1   Authorization - Number of Visits 10   OT Start Time 1301   OT Stop Time 1336   OT Time Calculation (min) 35 min   Activity Tolerance Patient tolerated treatment well   Behavior During Therapy [facility] for tasks assessed/performed      Past Medical History:  Diagnosis Date  . Diverticula of colon   . DM (diabetes mellitus) ([place])   . Hyperplastic colon polyp 02/23/2007  . Hypertension   . Stroke ([place])   . Tubular adenoma of colon 02/23/2007  . Vertigo     Past Surgical History:  Procedure Laterality Date  . COLONOSCOPY  02/23/2007   Dr. Gala Romney- tubular adenoma, hyperplastic polyp, diverticula  . COLONOSCOPY  04/28/2012   Procedure: COLONOSCOPY;  Surgeon: Daneil Dolin, MD;  [facility];  Service: Endoscopy;  Laterality: N/A;  8:30    There were no vitals filed for this visit.      Subjective Assessment - 12/17/16 1453    Subjective  S: I only wear my sling occasionally now.    Pertinent History Pt is a 71 y/o female s/p left proximal humerus fracture on 10/22/16. Pt saw MD on 12/10/16 and has been released from sling and instructed to begin therapy. Pt was referred to occupational therapy for evaluation and treatment by Dr. Edmonia Lynch.    Limitations weakness of LUE s/p CVA   Patient Stated Goals To be able to use my left arm better.    Currently in Pain? No/denies           [facility] OT Assessment -  12/17/16 1302      Assessment   Diagnosis Left proximal humerus fracture   Referring Provider Dr. Edmonia Lynch   Onset Date 10/22/16   Prior Therapy None     Precautions   Precautions None   Precaution Comments Progress as tolerated     Restrictions   Weight Bearing Restrictions No     Balance Screen   Has the patient fallen in the past 6 months Yes   How many times? 1   Has the patient had a decrease in activity level because of a fear of falling?  No   Is the patient reluctant to leave their home because of a fear of falling?  No     Prior Function   Level of Independence Independent   Vocation Retired   Passenger transport manager, gardening and Medical laboratory scientific officer, painting     ADL   ADL comments Pt is having difficulty with dressing, washing and fixing hair, grooming tasks, reaching up into cabinets, lifting and carrying weighted objects, sweeping      Written Expression   Dominant Hand Left  until stroke, now uses right hand as dominant     Cognition   Overall Cognitive Status Within Functional Limits for tasks assessed     ROM / Strength   AROM / PROM /  Strength AROM;PROM;Strength     Palpation   Palpation comment Max fascial restrictions along left upper arm, trapezius, and scapularis regions     AROM   Overall AROM Comments Assessed seated, er/IR adducted   AROM Assessment Site Shoulder   Right/Left Shoulder Left   Left Shoulder Flexion 110 Degrees   Left Shoulder ABduction 150 Degrees   Left Shoulder Internal Rotation 90 Degrees   Left Shoulder External Rotation 41 Degrees     PROM   Overall PROM Comments Assessed supine, er/IR adducted   PROM Assessment Site Shoulder   Right/Left Shoulder Left   Left Shoulder Flexion 150 Degrees   Left Shoulder ABduction 180 Degrees   Left Shoulder Internal Rotation 90 Degrees   Left Shoulder External Rotation 55 Degrees     Strength   Overall Strength Comments Assessed seated, er/IR adducted   Strength Assessment Site Shoulder    Right/Left Shoulder Left   Left Shoulder Flexion 4-/5   Left Shoulder ABduction 4-/5   Left Shoulder Internal Rotation 4-/5   Left Shoulder External Rotation 4-/5                         OT Education - 12/17/16 1453    Education provided Yes   Education Details shoulder stretches   Person(s) Educated Patient   Methods Explanation;Demonstration;Handout   Comprehension Verbalized understanding;Returned demonstration          OT Short Term Goals - 12/17/16 1458      OT SHORT TERM GOAL #1   Title Pt will be provided with and educated on HEP to improve functional LUE use.    Time 4   Period Weeks   Status New     OT SHORT TERM GOAL #2   Title Pt will decrease pain in LUE to 2/10 or less to improve use of LUE as dominant during daily tasks.    Time 4   Period Weeks   Status New     OT SHORT TERM GOAL #3   Title Pt will decrease LUE fascial restrictions from max to mod amounts or less to improve mobility required for functional reaching tasks.    Time 4   Period Weeks   Status New     OT SHORT TERM GOAL #4   Title Pt will improve A/ROM of LUE to [facility] to improve ability to dry and fix her hair.    Time 4   Period Weeks   Status New     OT SHORT TERM GOAL #5   Title Pt will improve LUE strength to 4/5 or greater to increase ability to use LUE as dominant during gardening and canning tasks.    Time 4   Period Weeks   Status New                  Plan - 12/17/16 1455    Clinical Impression Statement A: Pt is a 71 y/o female s/p left proximal humerus fracture on 10/22/16 presenting with increased pain and fascial restrictions, decreased range of motion and strength of LUE limiting functional use. Pt provided with shoulder stretches for HEP this session.    Rehab Potential Good   OT Frequency 2x / week   OT Duration 4 weeks   OT Treatment/Interventions Self-care/ADL training;Therapeutic exercise;Patient/family education;Manual  Therapy;Ultrasound;Therapeutic activities;Electrical Stimulation;Cryotherapy;Moist Heat;Passive range of motion   Plan P: Pt will benefit from skilled OT services to decrease LUE pain and fascial restrictions, increase ROM, strength,  and functional use during daily tasks. Treatment plan: Myofascial release, manual therapy, P/ROM, A/ROM, general LUE strengthening, scapular stability and strengthening, modalities as needed   OT Home Exercise Plan 2/1: shoulder stretches   Consulted and Agree with Plan of Care Patient      Patient will benefit from skilled therapeutic intervention in order to improve the following deficits and impairments:  Impaired flexibility, Decreased strength, Decreased range of motion, Pain, Increased fascial restricitons, Impaired UE functional use  Visit Diagnosis: Acute pain of left shoulder  Stiffness of left shoulder, not elsewhere classified  Other symptoms and signs involving the musculoskeletal system      G-Codes - 2016-12-26 1501    Functional Assessment Tool Used clinical judgement   Functional Limitation Carrying, moving and handling objects   Carrying, Moving and Handling Objects Current Status [id number]) At least 40 percent but less than 60 percent impaired, limited or restricted   Carrying, Moving and Handling Objects Goal Status [id number]) At least 20 percent but less than 40 percent impaired, limited or restricted      Problem List Patient Active Problem List   Diagnosis Date Noted  . Colon polyps 03/29/2012  . Family history of colon cancer 03/29/2012   Guadelupe Sabin, OTR/L  [phone] 12-26-2016, 3:02 PM  [place], [place], [address] Phone: [phone]   Fax:  [phone]  Name: Nina Grant MRN: [id number] Date of Birth: 1946-11-08

## 2016-12-17 NOTE — Patient Instructions (Signed)
  1) Flexion Wall Stretch    Face wall, place affected handon wall in front of you. Slide hand up the wall  and lean body in towards the wall. Hold for 5 seconds. Repeat 3-5 times. 1-2 times/day.     2) Towel Stretch with Internal Rotation   Gently pull up your affected arm  behind your back with the assist of a towel. Repeat 3-5X, complete 1-2 times per day            3) Corner Stretch    Stand at a corner of a wall, place your arms on the walls with elbows bent. Lean into the corner until a stretch is felt along the front of your chest and/or shoulders. Hold for 5 seconds. Repeat 3-5X, 1-2 times/day.    4) Posterior Capsule Stretch    Bring the involved arm across chest. Grasp elbow and pull toward chest until you feel a stretch in the back of the upper arm and shoulder. Hold 5 seconds. Repeat 3-5X. Complete 1-2 times/day.    5) Scapular Retraction    Tuck chin back as you pinch shoulder blades together.  Hold 5 seconds. Repeat 3-5X. Complete 1-2 times/day.    6) External Rotation Stretch:     Place your affected hand on the wall with the elbow bent and gently turn your body the opposite direction until a stretch is felt. Repeat 3-5X, 1-2 times per day.

## 2016-12-21 ENCOUNTER — Encounter (HOSPITAL_COMMUNITY): Payer: Self-pay

## 2016-12-21 ENCOUNTER — Ambulatory Visit (HOSPITAL_COMMUNITY): Payer: PPO

## 2016-12-21 DIAGNOSIS — R29898 Other symptoms and signs involving the musculoskeletal system: Secondary | ICD-10-CM

## 2016-12-21 DIAGNOSIS — M25612 Stiffness of left shoulder, not elsewhere classified: Secondary | ICD-10-CM

## 2016-12-21 DIAGNOSIS — M25512 Pain in left shoulder: Secondary | ICD-10-CM | POA: Diagnosis not present

## 2016-12-21 NOTE — Therapy (Signed)
[place], [place], [address] Phone: [phone]   Fax:  [phone]  Occupational Therapy Treatment  Patient Details  Name: Nina Grant MRN: [id number] Date of Birth: Nov 06, 1946 Referring Provider: Dr. Edmonia Lynch  Encounter Date: 12/21/2016      OT End of Session - 12/21/16 1135    Visit Number 2   Number of Visits 8   Date for OT Re-Evaluation 01/16/17   Authorization Type BCBS Medicare   Authorization Time Period Before 10th visit; $15 copay   Authorization - Visit Number 2   Authorization - Number of Visits 10   OT Start Time 1035   OT Stop Time 1115   OT Time Calculation (min) 40 min   Activity Tolerance Patient tolerated treatment well   Behavior During Therapy [facility] for tasks assessed/performed      Past Medical History:  Diagnosis Date  . Diverticula of colon   . DM (diabetes mellitus) ([place])   . Hyperplastic colon polyp 02/23/2007  . Hypertension   . Stroke ([place])   . Tubular adenoma of colon 02/23/2007  . Vertigo     Past Surgical History:  Procedure Laterality Date  . COLONOSCOPY  02/23/2007   Dr. Gala Romney- tubular adenoma, hyperplastic polyp, diverticula  . COLONOSCOPY  04/28/2012   Procedure: COLONOSCOPY;  Surgeon: Daneil Dolin, MD;  [facility];  Service: Endoscopy;  Laterality: N/A;  8:30    There were no vitals filed for this visit.      Subjective Assessment - 12/21/16 1122    Subjective  S: I was actually able to hold the hair dryer this morning for the first time with the left arm.   Currently in Pain? No/denies            [facility] OT Assessment - 12/21/16 1123      Assessment   Diagnosis Left proximal humerus fracture     Precautions   Precautions None   Precaution Comments Progress as tolerated                  OT Treatments/Exercises (OP) - 12/21/16 1123      Exercises   Exercises Shoulder     Shoulder Exercises: Supine   Protraction  PROM;AROM;10 reps   Horizontal ABduction PROM;AROM;10 reps   External Rotation PROM;AROM;10 reps   Internal Rotation PROM;AROM;10 reps   Flexion PROM;AROM;10 reps   ABduction PROM;AROM;10 reps     Shoulder Exercises: Standing   Protraction AROM;10 reps   Horizontal ABduction AROM;10 reps   External Rotation AROM;10 reps   Internal Rotation AROM;10 reps   Flexion AROM;10 reps   ABduction AROM;10 reps   Extension Theraband;10 reps   Theraband Level (Shoulder Extension) Level 2 (Red)   Row Theraband;10 reps   Theraband Level (Shoulder Row) Level 2 (Red)   Retraction Theraband;10 reps   Theraband Level (Shoulder Retraction) Level 2 (Red)     Shoulder Exercises: ROM/Strengthening   UBE (Upper Arm Bike) Level 1 2' forward 2' reverse   Wall Wash 1'   Proximal Shoulder Strengthening, Supine 10X with VC for form. No rest breaks.     Manual Therapy   Manual Therapy Myofascial release   Manual therapy comments Manual therapy completed prior to exercises.   Myofascial Release Myofascial release and manual stretching completed to left upper arm, trapezius, and scapularis region to decrease fascial restrictions and increase joint mobility in a pain free zone.  OT Education - 12/21/16 1134    Education provided Yes   Education Details Pt was given OT evaluation. Reviewed goals and plan of care.   Person(s) Educated Patient   Methods Explanation;Handout   Comprehension Verbalized understanding          OT Short Term Goals - 12/21/16 1054      OT SHORT TERM GOAL #1   Title Pt will be provided with and educated on HEP to improve functional LUE use.    Time 4   Period Weeks   Status On-going     OT SHORT TERM GOAL #2   Title Pt will decrease pain in LUE to 2/10 or less to improve use of LUE as dominant during daily tasks.    Time 4   Period Weeks   Status On-going     OT SHORT TERM GOAL #3   Title Pt will decrease LUE fascial restrictions from max to mod  amounts or less to improve mobility required for functional reaching tasks.    Time 4   Period Weeks   Status On-going     OT SHORT TERM GOAL #4   Title Pt will improve A/ROM of LUE to [facility] to improve ability to dry and fix her hair.    Time 4   Period Weeks   Status On-going     OT SHORT TERM GOAL #5   Title Pt will improve LUE strength to 4/5 or greater to increase ability to use LUE as dominant during gardening and canning tasks.    Time 4   Period Weeks   Status On-going                  Plan - 12/21/16 1135    Clinical Impression Statement A: Initiated myofascial release, manual stretching, A/ROM, and scapular strengthening. Patient's P/ROM is almost completing WNL. During A/ROM exercises patient required max VC for form and technique. Patient frequently rushed through exercises when becoming fatigued.    Plan P: Continue to work on form and independence with exercises. Add overhead lacing, and sidelying exercises to increase scapular strength.      Patient will benefit from skilled therapeutic intervention in order to improve the following deficits and impairments:  Impaired flexibility, Decreased strength, Decreased range of motion, Pain, Increased fascial restricitons, Impaired UE functional use  Visit Diagnosis: Acute pain of left shoulder  Stiffness of left shoulder, not elsewhere classified  Other symptoms and signs involving the musculoskeletal system    Problem List Patient Active Problem List   Diagnosis Date Noted  . Colon polyps 03/29/2012  . Family history of colon cancer 03/29/2012   Ailene Ravel, OTR/L,CBIS  [phone]  12/21/2016, 11:38 AM  [employer] [address] [place], [place], [address] Phone: [phone]   Fax:  [phone]  Name: Nina Grant MRN: [id number] Date of Birth: 31-Jul-1946

## 2016-12-23 ENCOUNTER — Ambulatory Visit (HOSPITAL_COMMUNITY): Payer: PPO | Admitting: Occupational Therapy

## 2016-12-23 ENCOUNTER — Encounter (HOSPITAL_COMMUNITY): Payer: Self-pay | Admitting: Occupational Therapy

## 2016-12-23 DIAGNOSIS — M25512 Pain in left shoulder: Secondary | ICD-10-CM

## 2016-12-23 DIAGNOSIS — M25612 Stiffness of left shoulder, not elsewhere classified: Secondary | ICD-10-CM

## 2016-12-23 DIAGNOSIS — R29898 Other symptoms and signs involving the musculoskeletal system: Secondary | ICD-10-CM

## 2016-12-23 NOTE — Therapy (Signed)
[place], [place], [address] Phone: [phone]   Fax:  [phone]  Occupational Therapy Treatment  Patient Details  Name: Nina Grant MRN: [id number] Date of Birth: 03/23/1946 Referring Provider: Dr. Edmonia Lynch  Encounter Date: 12/23/2016      OT End of Session - 12/23/16 1024    Visit Number 3   Number of Visits 8   Date for OT Re-Evaluation 01/16/17   Authorization Type BCBS Medicare   Authorization Time Period Before 10th visit; $15 copay   Authorization - Visit Number 3   Authorization - Number of Visits 10   OT Start Time 0940   OT Stop Time 1022   OT Time Calculation (min) 42 min   Activity Tolerance Patient tolerated treatment well   Behavior During Therapy [facility] for tasks assessed/performed      Past Medical History:  Diagnosis Date  . Diverticula of colon   . DM (diabetes mellitus) ([place])   . Hyperplastic colon polyp 02/23/2007  . Hypertension   . Stroke ([place])   . Tubular adenoma of colon 02/23/2007  . Vertigo     Past Surgical History:  Procedure Laterality Date  . COLONOSCOPY  02/23/2007   Dr. Gala Romney- tubular adenoma, hyperplastic polyp, diverticula  . COLONOSCOPY  04/28/2012   Procedure: COLONOSCOPY;  Surgeon: Daneil Dolin, MD;  [facility];  Service: Endoscopy;  Laterality: N/A;  8:30    There were no vitals filed for this visit.      Subjective Assessment - 12/23/16 0939    Subjective  S: Last night was gthe first night I slept without the sling.    Currently in Pain? No/denies            [facility] OT Assessment - 12/23/16 0939      Assessment   Diagnosis Left proximal humerus fracture     Precautions   Precautions None   Precaution Comments Progress as tolerated                  OT Treatments/Exercises (OP) - 12/23/16 0944      Exercises   Exercises Shoulder     Shoulder Exercises: Supine   Protraction PROM;5 reps   Horizontal ABduction  PROM;5 reps   External Rotation PROM;5 reps   Internal Rotation PROM;5 reps   Flexion PROM;5 reps   ABduction PROM;5 reps     Shoulder Exercises: Sidelying   External Rotation AROM;10 reps   Internal Rotation AROM;10 reps   Flexion AROM;10 reps   ABduction AROM;10 reps   Other Sidelying Exercises protraction, A/ROM, 10X     Shoulder Exercises: Standing   Protraction AROM;10 reps   Horizontal ABduction AROM;10 reps   External Rotation AROM;10 reps   Internal Rotation AROM;10 reps   Flexion AROM;10 reps   ABduction AROM;10 reps   Extension Theraband;10 reps   Theraband Level (Shoulder Extension) Level 2 (Red)   Row Theraband;10 reps   Theraband Level (Shoulder Row) Level 2 (Red)   Retraction Theraband;10 reps   Theraband Level (Shoulder Retraction) Level 2 (Red)     Shoulder Exercises: ROM/Strengthening   UBE (Upper Arm Bike) Level 1 2' forward 2' reverse   Wall Wash 1'   Prot/Ret//Elev/Dep 1'     Manual Therapy   Manual Therapy Myofascial release   Manual therapy comments Manual therapy completed prior to exercises.   Myofascial Release Myofascial release and manual stretching completed to left upper arm, trapezius, and  scapularis region to decrease fascial restrictions and increase joint mobility in a pain free zone.                   OT Short Term Goals - 12/21/16 1054      OT SHORT TERM GOAL #1   Title Pt will be provided with and educated on HEP to improve functional LUE use.    Time 4   Period Weeks   Status On-going     OT SHORT TERM GOAL #2   Title Pt will decrease pain in LUE to 2/10 or less to improve use of LUE as dominant during daily tasks.    Time 4   Period Weeks   Status On-going     OT SHORT TERM GOAL #3   Title Pt will decrease LUE fascial restrictions from max to mod amounts or less to improve mobility required for functional reaching tasks.    Time 4   Period Weeks   Status On-going     OT SHORT TERM GOAL #4   Title Pt will  improve A/ROM of LUE to [facility] to improve ability to dry and fix her hair.    Time 4   Period Weeks   Status On-going     OT SHORT TERM GOAL #5   Title Pt will improve LUE strength to 4/5 or greater to increase ability to use LUE as dominant during gardening and canning tasks.    Time 4   Period Weeks   Status On-going                  Plan - 12/23/16 1024    Clinical Impression Statement A: Added sidelying A/ROM, overhead lacing, and prot/ret/elev/dep exercises this session. Pt requires intermittent verbal cuing for proper form during exercises, cuing for speed when fatigued. Pt reports she slept without her sling last night and woke up with her arm overhead and had no pain.    Plan P: Update HEP for A/ROM exercises, continue to work on proper form during exercises; add x to v arms if able to complete with good form.    OT Home Exercise Plan 2/1: shoulder stretches   Consulted and Agree with Plan of Care Patient      Patient will benefit from skilled therapeutic intervention in order to improve the following deficits and impairments:  Impaired flexibility, Decreased strength, Decreased range of motion, Pain, Increased fascial restricitons, Impaired UE functional use  Visit Diagnosis: Acute pain of left shoulder  Stiffness of left shoulder, not elsewhere classified  Other symptoms and signs involving the musculoskeletal system    Problem List Patient Active Problem List   Diagnosis Date Noted  . Colon polyps 03/29/2012  . Family history of colon cancer 03/29/2012   Guadelupe Sabin, OTR/L  [phone] 12/23/2016, 10:26 AM  [place] [address] [place], [place], [address] Phone: [phone]   Fax:  [phone]  Name: Nina Grant MRN: [id number] Date of Birth: 1946/09/27

## 2016-12-28 ENCOUNTER — Ambulatory Visit (HOSPITAL_COMMUNITY): Payer: PPO

## 2016-12-28 DIAGNOSIS — R29898 Other symptoms and signs involving the musculoskeletal system: Secondary | ICD-10-CM

## 2016-12-28 DIAGNOSIS — M25512 Pain in left shoulder: Secondary | ICD-10-CM

## 2016-12-28 DIAGNOSIS — M25612 Stiffness of left shoulder, not elsewhere classified: Secondary | ICD-10-CM

## 2016-12-28 NOTE — Therapy (Signed)
[place], [place], [address] Phone: [phone]   Fax:  [phone]  Occupational Therapy Treatment  Patient Details  Name: Nina Grant MRN: [id number] Date of Birth: 06-May-1946 Referring Provider: Dr. Edmonia Lynch  Encounter Date: 12/28/2016      OT End of Session - 12/28/16 1142    Visit Number 4   Number of Visits 8   Date for OT Re-Evaluation 01/16/17   Authorization Type BCBS Medicare   Authorization Time Period Before 10th visit; $15 copay   Authorization - Visit Number 4   Authorization - Number of Visits 10   OT Start Time [id number]   OT Stop Time 1115   OT Time Calculation (min) 40 min   Activity Tolerance Patient tolerated treatment well   Behavior During Therapy [facility] for tasks assessed/performed      Past Medical History:  Diagnosis Date  . Diverticula of colon   . DM (diabetes mellitus) ([place])   . Hyperplastic colon polyp 02/23/2007  . Hypertension   . Stroke ([place])   . Tubular adenoma of colon 02/23/2007  . Vertigo     Past Surgical History:  Procedure Laterality Date  . COLONOSCOPY  02/23/2007   Dr. Gala Romney- tubular adenoma, hyperplastic polyp, diverticula  . COLONOSCOPY  04/28/2012   Procedure: COLONOSCOPY;  Surgeon: Daneil Dolin, MD;  [facility];  Service: Endoscopy;  Laterality: N/A;  8:30    There were no vitals filed for this visit.                    OT Treatments/Exercises (OP) - 12/28/16 1057      Exercises   Exercises Shoulder     Shoulder Exercises: Standing   Protraction AROM;Strengthening;10 reps   Protraction Weight (lbs) 1   Horizontal ABduction AROM;Strengthening;10 reps   Horizontal ABduction Weight (lbs) 1   External Rotation Strengthening;10 reps   External Rotation Weight (lbs) 1   Internal Rotation Strengthening;10 reps   Internal Rotation Weight (lbs) 1   Flexion AROM;10 reps   ABduction AROM;10 reps   Extension Theraband;10  reps   Theraband Level (Shoulder Extension) Level 2 (Red)   Row Theraband;10 reps   Theraband Level (Shoulder Row) Level 2 (Red)   Retraction Theraband;10 reps   Theraband Level (Shoulder Retraction) Level 2 (Red)     Shoulder Exercises: ROM/Strengthening   UBE (Upper Arm Bike) Level 1 2' forward 2' reverse   Over Head Lace 1'   "W" Arms 10X   X to V Arms 10X   Proximal Shoulder Strengthening, Seated 10X no rest breaks     Manual Therapy   Manual Therapy Myofascial release   Manual therapy comments Manual therapy completed prior to exercises.   Myofascial Release Myofascial release and manual stretching completed to left upper arm, trapezius, and scapularis region to decrease fascial restrictions and increase joint mobility in a pain free zone.                 OT Education - 12/28/16 1056    Education provided Yes   Education Details A/ROM exercises   Person(s) Educated Patient   Methods Explanation;Handout;Demonstration;Verbal cues;Tactile cues   Comprehension Returned demonstration;Verbalized understanding          OT Short Term Goals - 12/21/16 1054      OT SHORT TERM GOAL #1   Title Pt will be provided with and educated on HEP to improve functional LUE use.  Time 4   Period Weeks   Status On-going     OT SHORT TERM GOAL #2   Title Pt will decrease pain in LUE to 2/10 or less to improve use of LUE as dominant during daily tasks.    Time 4   Period Weeks   Status On-going     OT SHORT TERM GOAL #3   Title Pt will decrease LUE fascial restrictions from max to mod amounts or less to improve mobility required for functional reaching tasks.    Time 4   Period Weeks   Status On-going     OT SHORT TERM GOAL #4   Title Pt will improve A/ROM of LUE to [facility] to improve ability to dry and fix her hair.    Time 4   Period Weeks   Status On-going     OT SHORT TERM GOAL #5   Title Pt will improve LUE strength to 4/5 or greater to increase ability to use LUE as  dominant during gardening and canning tasks.    Time 4   Period Weeks   Status On-going                  Plan - 12/28/16 1143    Clinical Impression Statement A: Added X tp V arms and W arms. Updated HEP to included shoulder A/ROM exercises. Pt continues to require VC to slow down movment and to focus on form. She was also able to use a low weight for a portion of her shoulder exercises this session. Requested to not complete sidelying exercises this session.   Plan P: Follow up onm updated HEP. Continue to work on form and progress to completing all A/ROM with 1# when able.      Patient will benefit from skilled therapeutic intervention in order to improve the following deficits and impairments:  Impaired flexibility, Decreased strength, Decreased range of motion, Pain, Increased fascial restricitons, Impaired UE functional use  Visit Diagnosis: Acute pain of left shoulder  Stiffness of left shoulder, not elsewhere classified  Other symptoms and signs involving the musculoskeletal system    Problem List Patient Active Problem List   Diagnosis Date Noted  . Colon polyps 03/29/2012  . Family history of colon cancer 03/29/2012    Ailene Ravel, OTR/L,CBIS  [phone]   12/28/2016, 11:47 AM  [name] [address] [place], [place], [address] Phone: [phone]   Fax:  [phone]  Name: Nina Grant MRN: [id number] Date of Birth: December 28, 1945

## 2016-12-28 NOTE — Patient Instructions (Signed)
*  Slow down. Don't throw your arm up.    1) Shoulder Protraction    Begin with elbows by your side, slowly "punch" straight out in front of you.      2) Shoulder Flexion  Standing:         Begin with arms at your side with thumbs pointed up, slowly raise both arms up and forward towards overhead.        3) Horizontal abduction/adduction   Standing:           Begin with arms straight out in front of you, bring out to the side in at "T" shape. Keep arms straight entire time.      4) Internal & External Rotation    *No band* -Stand with elbows at the side and elbows bent 90 degrees. Move your forearms away from your body, then bring back inward toward the body.     5) Shoulder Abduction  Standing:       Lying on your back begin with your arms flat on the table next to your side. Slowly move your arms out to the side so that they go overhead, in a jumping jack or snow angel movement.    6) X to V arms (cheerleader move):  Begin with arms straight down, crossed in front of body in an "X". Keeping arms crossed, lift arms straight up overhead. Then spread arms apart into a "V" shape.  Bring back together into x and lower down to starting position.    7) W arms:  Begin with elbows bent and even with shoulders, hands pointing to ceiling. Keeping elbows at shoulder level, 1-shrug shoulders up, 2-squeeze shoulder blades together, and 3-relax.    Repeat all exercises 10-15 times, 1-2 times per day.

## 2016-12-30 ENCOUNTER — Encounter (HOSPITAL_COMMUNITY): Payer: Self-pay

## 2016-12-30 ENCOUNTER — Ambulatory Visit (HOSPITAL_COMMUNITY): Payer: PPO

## 2016-12-30 DIAGNOSIS — M25612 Stiffness of left shoulder, not elsewhere classified: Secondary | ICD-10-CM

## 2016-12-30 DIAGNOSIS — R29898 Other symptoms and signs involving the musculoskeletal system: Secondary | ICD-10-CM

## 2016-12-30 DIAGNOSIS — M25512 Pain in left shoulder: Secondary | ICD-10-CM | POA: Diagnosis not present

## 2016-12-30 NOTE — Therapy (Signed)
[place], [place], [address] Phone: [phone]   Fax:  [phone]  Occupational Therapy Treatment  Patient Details  Name: Nina Grant MRN: [id number] Date of Birth: 10/09/1946 Referring Provider: Dr. Edmonia Lynch  Encounter Date: 12/30/2016      OT End of Session - 12/30/16 1114    Visit Number 5   Number of Visits 8   Date for OT Re-Evaluation 01/16/17   Authorization Type BCBS Medicare   Authorization Time Period Before 10th visit; $15 copay   Authorization - Visit Number 5   Authorization - Number of Visits 10   OT Start Time 1030   OT Stop Time 1115   OT Time Calculation (min) 45 min   Activity Tolerance Patient tolerated treatment well   Behavior During Therapy [facility] for tasks assessed/performed      Past Medical History:  Diagnosis Date  . Diverticula of colon   . DM (diabetes mellitus) ([place])   . Hyperplastic colon polyp 02/23/2007  . Hypertension   . Stroke ([place])   . Tubular adenoma of colon 02/23/2007  . Vertigo     Past Surgical History:  Procedure Laterality Date  . COLONOSCOPY  02/23/2007   Dr. Gala Romney- tubular adenoma, hyperplastic polyp, diverticula  . COLONOSCOPY  04/28/2012   Procedure: COLONOSCOPY;  Surgeon: Daneil Dolin, MD;  [facility];  Service: Endoscopy;  Laterality: N/A;  8:30    There were no vitals filed for this visit.      Subjective Assessment - 12/30/16 1032    Subjective  S: No issues to report.   Currently in Pain? No/denies            [facility] OT Assessment - 12/30/16 1046      Assessment   Diagnosis Left proximal humerus fracture     Precautions   Precautions None   Precaution Comments Progress as tolerated                  OT Treatments/Exercises (OP) - 12/30/16 1046      Exercises   Exercises Shoulder     Shoulder Exercises: Supine   Protraction PROM;5 reps;Strengthening;12 reps   Protraction Limitations 1   Horizontal ABduction PROM;5 reps;Strengthening;12 reps   Horizontal ABduction Weight (lbs) 1   External Rotation PROM;5 reps;Strengthening;12 reps   External Rotation Weight (lbs) 1   Internal Rotation PROM;5 reps;Strengthening;12 reps   Internal Rotation Weight (lbs) 1   Flexion PROM;5 reps;Strengthening;12 reps   Shoulder Flexion Weight (lbs) 1   ABduction PROM;5 reps;Strengthening;12 reps   Shoulder ABduction Weight (lbs) 1     Shoulder Exercises: Standing   Protraction Strengthening;10 reps   Protraction Weight (lbs) 1   Horizontal ABduction Strengthening;10 reps   Horizontal ABduction Weight (lbs) 1   External Rotation Strengthening;10 reps   External Rotation Weight (lbs) 1   Internal Rotation Strengthening;10 reps   Internal Rotation Weight (lbs) 1   Flexion Strengthening;10 reps   Shoulder Flexion Weight (lbs) 1   ABduction Strengthening;10 reps   Shoulder ABduction Weight (lbs) 1     Shoulder Exercises: ROM/Strengthening   UBE (Upper Arm Bike) Level 1 2' forward 2' reverse   Over Head Lace 2'   X to V Arms 10X  utilized floor floor     Manual Therapy   Manual Therapy Myofascial release   Manual therapy comments Manual therapy completed prior to exercises.   Myofascial Release Myofascial release and manual stretching  completed to left upper arm, trapezius, and scapularis region to decrease fascial restrictions and increase joint mobility in a pain free zone.                 OT Education - 12/30/16 1111    Education provided Yes   Education Details Pt instructed that she could start to use a small 1# weight or hold a water bottle/can of soup   Person(s) Educated Patient   Methods Explanation   Comprehension Verbalized understanding          OT Short Term Goals - 12/21/16 1054      OT SHORT TERM GOAL #1   Title Pt will be provided with and educated on HEP to improve functional LUE use.    Time 4   Period Weeks   Status On-going     OT SHORT TERM  GOAL #2   Title Pt will decrease pain in LUE to 2/10 or less to improve use of LUE as dominant during daily tasks.    Time 4   Period Weeks   Status On-going     OT SHORT TERM GOAL #3   Title Pt will decrease LUE fascial restrictions from max to mod amounts or less to improve mobility required for functional reaching tasks.    Time 4   Period Weeks   Status On-going     OT SHORT TERM GOAL #4   Title Pt will improve A/ROM of LUE to [facility] to improve ability to dry and fix her hair.    Time 4   Period Weeks   Status On-going     OT SHORT TERM GOAL #5   Title Pt will improve LUE strength to 4/5 or greater to increase ability to use LUE as dominant during gardening and canning tasks.    Time 4   Period Weeks   Status On-going                  Plan - 12/30/16 1115    Clinical Impression Statement A: utilized floor mirror for proper form during X to V arms although it did not benefit the patient as much as I hoped. She continued to have difficulty with carry with education regarding form.    Plan P: Measure for MD appointment.      Patient will benefit from skilled therapeutic intervention in order to improve the following deficits and impairments:  Impaired flexibility, Decreased strength, Decreased range of motion, Pain, Increased fascial restricitons, Impaired UE functional use  Visit Diagnosis: Acute pain of left shoulder  Stiffness of left shoulder, not elsewhere classified  Other symptoms and signs involving the musculoskeletal system    Problem List Patient Active Problem List   Diagnosis Date Noted  . Colon polyps 03/29/2012  . Family history of colon cancer 03/29/2012   Ailene Ravel, OTR/L,CBIS  [phone]  12/30/2016, 11:16 AM  [facility] [address] [place], [place], [address] Phone: [phone]   Fax:  [phone]  Name: Nina Grant MRN: [id number] Date of Birth: 1946/08/07

## 2017-01-04 ENCOUNTER — Ambulatory Visit (HOSPITAL_COMMUNITY): Payer: PPO

## 2017-01-04 ENCOUNTER — Encounter (HOSPITAL_COMMUNITY): Payer: Self-pay

## 2017-01-04 DIAGNOSIS — R29898 Other symptoms and signs involving the musculoskeletal system: Secondary | ICD-10-CM

## 2017-01-04 DIAGNOSIS — M25612 Stiffness of left shoulder, not elsewhere classified: Secondary | ICD-10-CM

## 2017-01-04 DIAGNOSIS — M25512 Pain in left shoulder: Secondary | ICD-10-CM

## 2017-01-04 NOTE — Patient Instructions (Signed)
Complete 10-15 repetitions. 1 time a day.  ELASTIC BAND SHOULDER EXTERNAL ROTATION - ER  While holding an elastic band at your side with your elbow bent, start with your hand near your stomach and then pull the band away. Keep your elbow at your side the entire time.     (Home) Extension: Isometric / Bilateral Arm Retraction - Sitting   Facing anchor, hold hands and elbow at shoulder height, with elbow bent.  Pull arms back to squeeze shoulder blades together. Repeat 10-15 times.  Copyright  VHI. All rights reserved.   (Home) Retraction: Row - Bilateral (Anchor)   Facing anchor, arms reaching forward, pull hands toward stomach, keeping elbows bent and at your sides and pinching shoulder blades together. Repeat 10-15 times.  Copyright  VHI. All rights reserved.   (Clinic) Extension / Flexion (Assist)   Face anchor, pull arms back, keeping elbow straight, and squeze shoulder blades together. Repeat 10-15 times.   Copyright  VHI. All rights reserved.

## 2017-01-04 NOTE — Therapy (Signed)
[place], [place], [address] Phone: [phone]   Fax:  [phone]  Occupational Therapy Treatment  Patient Details  Name: Nina Grant MRN: [id number] Date of Birth: 09/12/1946 Referring Provider: Dr. Edmonia Lynch  Encounter Date: 01/04/2017      OT End of Session - 01/04/17 1139    Visit Number 6   Number of Visits 8   Date for OT Re-Evaluation 01/16/17   Authorization Type BCBS Medicare   Authorization Time Period Before 10th visit; $15 copay   Authorization - Visit Number 5   Authorization - Number of Visits 10   OT Start Time 9169  discharge today   OT Stop Time 1115   OT Time Calculation (min) 40 min   Activity Tolerance Patient tolerated treatment well   Behavior During Therapy [facility] for tasks assessed/performed      Past Medical History:  Diagnosis Date  . Diverticula of colon   . DM (diabetes mellitus) ([place])   . Hyperplastic colon polyp 02/23/2007  . Hypertension   . Stroke ([place])   . Tubular adenoma of colon 02/23/2007  . Vertigo     Past Surgical History:  Procedure Laterality Date  . COLONOSCOPY  02/23/2007   Dr. Gala Romney- tubular adenoma, hyperplastic polyp, diverticula  . COLONOSCOPY  04/28/2012   Procedure: COLONOSCOPY;  Surgeon: Daneil Dolin, MD;  [facility];  Service: Endoscopy;  Laterality: N/A;  8:30    There were no vitals filed for this visit.      Subjective Assessment - 01/04/17 1137    Subjective  S: I don't have any pain when I'm using it at home.    Currently in Pain? No/denies            [facility] OT Assessment - 01/04/17 1035      Assessment   Diagnosis Left proximal humerus fracture   Onset Date 10/22/16     Precautions   Precautions None   Precaution Comments Progress as tolerated     Prior Function   Level of Independence Independent     AROM   Overall AROM Comments Assessed seated, er/IR adducted   AROM Assessment Site Shoulder   Right/Left Shoulder Left   Left Shoulder Flexion 145 Degrees  previous: 110   Left Shoulder ABduction 150 Degrees  previous:  same   Left Shoulder Internal Rotation 90 Degrees  previuos: same   Left Shoulder External Rotation 67 Degrees  previous: 41     PROM   Overall PROM Comments Assessed supine, er/IR adducted   PROM Assessment Site Shoulder   Right/Left Shoulder Left   Left Shoulder Flexion 156 Degrees  previous: 150   Left Shoulder ABduction 180 Degrees  previous: same   Left Shoulder Internal Rotation 90 Degrees  previous: same   Left Shoulder External Rotation 65 Degrees  previous: 55     Strength   Overall Strength Comments Assessed seated, er/IR adducted   Strength Assessment Site Shoulder   Right/Left Shoulder Left   Left Shoulder Flexion 5/5  previous; 4-/5   Left Shoulder ABduction 5/5  previous: 4-/5   Left Shoulder Internal Rotation 5/5  previous: 4-/5   Left Shoulder External Rotation 4-/5  previous: 4-/5                  OT Treatments/Exercises (OP) - 01/04/17 1137      Exercises   Exercises Shoulder     Shoulder Exercises: Supine  Protraction PROM;5 reps   Horizontal ABduction PROM;5 reps   External Rotation PROM;5 reps   Internal Rotation PROM;5 reps   Flexion PROM;5 reps   ABduction PROM;5 reps     Shoulder Exercises: Standing   External Rotation Theraband;10 reps   Theraband Level (Shoulder External Rotation) Level 2 (Red)   Extension Theraband;10 reps   Theraband Level (Shoulder Extension) Level 2 (Red)   Row Theraband;10 reps   Retraction Theraband;10 reps   Theraband Level (Shoulder Retraction) Level 2 (Red)     Manual Therapy   Manual Therapy Myofascial release   Manual therapy comments Manual therapy completed prior to exercises.   Myofascial Release Myofascial release and manual stretching completed to left upper arm, trapezius, and scapularis region to decrease fascial restrictions and increase joint mobility in a  pain free zone.                 OT Education - 2017/01/13 1139    Education provided Yes   Education Details Reviewed goals. HEP updated and reviewed.    Person(s) Educated Patient   Methods Explanation;Demonstration;Verbal cues;Handout   Comprehension Verbalized understanding;Returned demonstration          OT Short Term Goals - Jan 13, 2017 1050      OT SHORT TERM GOAL #1   Title Pt will be provided with and educated on HEP to improve functional LUE use.    Time 4   Period Weeks   Status Achieved     OT SHORT TERM GOAL #2   Title Pt will decrease pain in LUE to 2/10 or less to improve use of LUE as dominant during daily tasks.    Time 4   Period Weeks   Status Achieved     OT SHORT TERM GOAL #3   Title Pt will decrease LUE fascial restrictions from max to mod amounts or less to improve mobility required for functional reaching tasks.    Time 4   Period Weeks   Status Achieved     OT SHORT TERM GOAL #4   Title Pt will improve A/ROM of LUE to [facility] to improve ability to dry and fix her hair.    Time 4   Period Weeks   Status Achieved     OT SHORT TERM GOAL #5   Title Pt will improve LUE strength to 4/5 or greater to increase ability to use LUE as dominant during gardening and canning tasks.    Time 4   Period Weeks   Status Achieved                  Plan - January 13, 2017 1144    Clinical Impression Statement A: Measurements taken for MD appointment this week. Patient has met all therapy goals this date and her ROM is at baseline. Patient has some weakness with External rotation. HEP was updated and patient is able to contiune working on strength independently at home. Pt reports no deficits this date.   Plan P: D/C from therapy with HEP.      Patient will benefit from skilled therapeutic intervention in order to improve the following deficits and impairments:  Impaired flexibility, Decreased strength, Decreased range of motion, Pain, Increased fascial  restricitons, Impaired UE functional use  Visit Diagnosis: Acute pain of left shoulder  Stiffness of left shoulder, not elsewhere classified  Other symptoms and signs involving the musculoskeletal system      G-Codes - 01/13/17 1146  FOTO score: 70/100   Functional Limitation Carrying,  moving and handling objects   Carrying, Moving and Handling Objects Goal Status [phone]) At least 20 percent but less than 40 percent impaired, limited or restricted   Carrying, Moving and Handling Objects Discharge Status [phone]) At least 20 percent but less than 40 percent impaired, limited or restricted      Problem List Patient Active Problem List   Diagnosis Date Noted  . Colon polyps 03/29/2012  . Family history of colon cancer 03/29/2012   OCCUPATIONAL THERAPY DISCHARGE SUMMARY  Visits from Start of Care: 6  Current functional level related to goals / functional outcomes: See above   Remaining deficits: See above   Education / Equipment: See above Plan: Patient agrees to discharge.  Patient goals were met. Patient is being discharged due to meeting the stated rehab goals.  ?????         Ailene Ravel, OTR/L,CBIS  [phone]  01/04/2017, 11:50 AM  [place], [place], [address] Phone: [phone]   Fax:  [phone]  Name: Nina Grant MRN: [id number] Date of Birth: August 26, 1946

## 2017-01-06 ENCOUNTER — Ambulatory Visit (HOSPITAL_COMMUNITY): Payer: PPO

## 2017-01-06 DIAGNOSIS — M25512 Pain in left shoulder: Secondary | ICD-10-CM | POA: Diagnosis not present

## 2017-01-11 ENCOUNTER — Ambulatory Visit (HOSPITAL_COMMUNITY): Payer: PPO | Admitting: Specialist

## 2017-01-13 ENCOUNTER — Ambulatory Visit (HOSPITAL_COMMUNITY): Payer: PPO

## 2017-02-17 DIAGNOSIS — Z6828 Body mass index (BMI) 28.0-28.9, adult: Secondary | ICD-10-CM | POA: Diagnosis not present

## 2017-02-17 DIAGNOSIS — E663 Overweight: Secondary | ICD-10-CM | POA: Diagnosis not present

## 2017-02-17 DIAGNOSIS — E1129 Type 2 diabetes mellitus with other diabetic kidney complication: Secondary | ICD-10-CM | POA: Diagnosis not present

## 2017-02-17 DIAGNOSIS — I10 Essential (primary) hypertension: Secondary | ICD-10-CM | POA: Diagnosis not present

## 2017-02-17 DIAGNOSIS — E119 Type 2 diabetes mellitus without complications: Secondary | ICD-10-CM | POA: Diagnosis not present

## 2017-03-23 ENCOUNTER — Encounter: Payer: Self-pay | Admitting: Internal Medicine

## 2017-05-25 DIAGNOSIS — E1129 Type 2 diabetes mellitus with other diabetic kidney complication: Secondary | ICD-10-CM | POA: Diagnosis not present

## 2017-05-25 DIAGNOSIS — Z6828 Body mass index (BMI) 28.0-28.9, adult: Secondary | ICD-10-CM | POA: Diagnosis not present

## 2017-05-25 DIAGNOSIS — E663 Overweight: Secondary | ICD-10-CM | POA: Diagnosis not present

## 2017-05-25 DIAGNOSIS — Z1389 Encounter for screening for other disorder: Secondary | ICD-10-CM | POA: Diagnosis not present

## 2017-05-27 DIAGNOSIS — E119 Type 2 diabetes mellitus without complications: Secondary | ICD-10-CM | POA: Diagnosis not present

## 2017-08-24 DIAGNOSIS — Z23 Encounter for immunization: Secondary | ICD-10-CM | POA: Diagnosis not present

## 2017-08-24 DIAGNOSIS — E663 Overweight: Secondary | ICD-10-CM | POA: Diagnosis not present

## 2017-08-24 DIAGNOSIS — E1129 Type 2 diabetes mellitus with other diabetic kidney complication: Secondary | ICD-10-CM | POA: Diagnosis not present

## 2017-08-24 DIAGNOSIS — Z1389 Encounter for screening for other disorder: Secondary | ICD-10-CM | POA: Diagnosis not present

## 2017-08-24 DIAGNOSIS — Z6829 Body mass index (BMI) 29.0-29.9, adult: Secondary | ICD-10-CM | POA: Diagnosis not present

## 2017-08-24 DIAGNOSIS — I10 Essential (primary) hypertension: Secondary | ICD-10-CM | POA: Diagnosis not present

## 2017-11-30 DIAGNOSIS — E063 Autoimmune thyroiditis: Secondary | ICD-10-CM | POA: Diagnosis not present

## 2017-11-30 DIAGNOSIS — M1991 Primary osteoarthritis, unspecified site: Secondary | ICD-10-CM | POA: Diagnosis not present

## 2017-11-30 DIAGNOSIS — E1129 Type 2 diabetes mellitus with other diabetic kidney complication: Secondary | ICD-10-CM | POA: Diagnosis not present

## 2017-11-30 DIAGNOSIS — Z6828 Body mass index (BMI) 28.0-28.9, adult: Secondary | ICD-10-CM | POA: Diagnosis not present

## 2017-11-30 DIAGNOSIS — Z1389 Encounter for screening for other disorder: Secondary | ICD-10-CM | POA: Diagnosis not present

## 2017-11-30 DIAGNOSIS — N182 Chronic kidney disease, stage 2 (mild): Secondary | ICD-10-CM | POA: Diagnosis not present

## 2017-11-30 DIAGNOSIS — E663 Overweight: Secondary | ICD-10-CM | POA: Diagnosis not present

## 2018-01-31 ENCOUNTER — Other Ambulatory Visit (HOSPITAL_COMMUNITY): Payer: Self-pay | Admitting: Internal Medicine

## 2018-01-31 DIAGNOSIS — Z1231 Encounter for screening mammogram for malignant neoplasm of breast: Secondary | ICD-10-CM

## 2018-02-01 DIAGNOSIS — Z6829 Body mass index (BMI) 29.0-29.9, adult: Secondary | ICD-10-CM | POA: Diagnosis not present

## 2018-02-01 DIAGNOSIS — J040 Acute laryngitis: Secondary | ICD-10-CM | POA: Diagnosis not present

## 2018-02-01 DIAGNOSIS — J22 Unspecified acute lower respiratory infection: Secondary | ICD-10-CM | POA: Diagnosis not present

## 2018-02-01 DIAGNOSIS — E663 Overweight: Secondary | ICD-10-CM | POA: Diagnosis not present

## 2018-02-02 ENCOUNTER — Ambulatory Visit (HOSPITAL_COMMUNITY)
Admission: RE | Admit: 2018-02-02 | Discharge: 2018-02-02 | Disposition: A | Discharge status: Home / Self Care | Payer: PPO | Source: Ambulatory Visit | Attending: Internal Medicine | Admitting: Internal Medicine

## 2018-02-02 ENCOUNTER — Encounter (HOSPITAL_COMMUNITY): Payer: Self-pay

## 2018-02-02 DIAGNOSIS — Z1231 Encounter for screening mammogram for malignant neoplasm of breast: Secondary | ICD-10-CM | POA: Diagnosis not present

## 2018-02-15 ENCOUNTER — Other Ambulatory Visit (HOSPITAL_COMMUNITY): Payer: Self-pay | Admitting: Internal Medicine

## 2018-02-15 DIAGNOSIS — R928 Other abnormal and inconclusive findings on diagnostic imaging of breast: Secondary | ICD-10-CM

## 2018-02-22 ENCOUNTER — Other Ambulatory Visit (HOSPITAL_COMMUNITY): Payer: Self-pay | Admitting: Internal Medicine

## 2018-02-22 ENCOUNTER — Ambulatory Visit (HOSPITAL_COMMUNITY)
Admission: RE | Admit: 2018-02-22 | Discharge: 2018-02-22 | Disposition: A | Discharge status: Home / Self Care | Payer: PPO | Source: Ambulatory Visit | Attending: Internal Medicine | Admitting: Internal Medicine

## 2018-02-22 DIAGNOSIS — N6311 Unspecified lump in the right breast, upper outer quadrant: Secondary | ICD-10-CM | POA: Diagnosis not present

## 2018-02-22 DIAGNOSIS — R922 Inconclusive mammogram: Secondary | ICD-10-CM | POA: Diagnosis not present

## 2018-02-22 DIAGNOSIS — R928 Other abnormal and inconclusive findings on diagnostic imaging of breast: Secondary | ICD-10-CM | POA: Diagnosis not present

## 2018-03-03 DIAGNOSIS — Z1389 Encounter for screening for other disorder: Secondary | ICD-10-CM | POA: Diagnosis not present

## 2018-03-03 DIAGNOSIS — E663 Overweight: Secondary | ICD-10-CM | POA: Diagnosis not present

## 2018-03-03 DIAGNOSIS — E1129 Type 2 diabetes mellitus with other diabetic kidney complication: Secondary | ICD-10-CM | POA: Diagnosis not present

## 2018-03-03 DIAGNOSIS — Z0000 Encounter for general adult medical examination without abnormal findings: Secondary | ICD-10-CM | POA: Diagnosis not present

## 2018-03-03 DIAGNOSIS — Z6828 Body mass index (BMI) 28.0-28.9, adult: Secondary | ICD-10-CM | POA: Diagnosis not present

## 2018-03-03 DIAGNOSIS — E119 Type 2 diabetes mellitus without complications: Secondary | ICD-10-CM | POA: Diagnosis not present

## 2018-05-31 DIAGNOSIS — E1129 Type 2 diabetes mellitus with other diabetic kidney complication: Secondary | ICD-10-CM | POA: Diagnosis not present

## 2018-06-08 DIAGNOSIS — E119 Type 2 diabetes mellitus without complications: Secondary | ICD-10-CM | POA: Diagnosis not present

## 2018-06-08 DIAGNOSIS — E663 Overweight: Secondary | ICD-10-CM | POA: Diagnosis not present

## 2018-06-08 DIAGNOSIS — Z1389 Encounter for screening for other disorder: Secondary | ICD-10-CM | POA: Diagnosis not present

## 2018-06-08 DIAGNOSIS — I10 Essential (primary) hypertension: Secondary | ICD-10-CM | POA: Diagnosis not present

## 2018-06-08 DIAGNOSIS — E063 Autoimmune thyroiditis: Secondary | ICD-10-CM | POA: Diagnosis not present

## 2018-06-08 DIAGNOSIS — Z6829 Body mass index (BMI) 29.0-29.9, adult: Secondary | ICD-10-CM | POA: Diagnosis not present

## 2018-06-08 DIAGNOSIS — M1991 Primary osteoarthritis, unspecified site: Secondary | ICD-10-CM | POA: Diagnosis not present

## 2018-09-19 DIAGNOSIS — Z6829 Body mass index (BMI) 29.0-29.9, adult: Secondary | ICD-10-CM | POA: Diagnosis not present

## 2018-09-19 DIAGNOSIS — E063 Autoimmune thyroiditis: Secondary | ICD-10-CM | POA: Diagnosis not present

## 2018-09-19 DIAGNOSIS — E663 Overweight: Secondary | ICD-10-CM | POA: Diagnosis not present

## 2018-09-19 DIAGNOSIS — I10 Essential (primary) hypertension: Secondary | ICD-10-CM | POA: Diagnosis not present

## 2018-09-19 DIAGNOSIS — Z1389 Encounter for screening for other disorder: Secondary | ICD-10-CM | POA: Diagnosis not present

## 2018-09-19 DIAGNOSIS — M1991 Primary osteoarthritis, unspecified site: Secondary | ICD-10-CM | POA: Diagnosis not present

## 2018-09-19 DIAGNOSIS — E1129 Type 2 diabetes mellitus with other diabetic kidney complication: Secondary | ICD-10-CM | POA: Diagnosis not present

## 2018-10-19 DIAGNOSIS — Z23 Encounter for immunization: Secondary | ICD-10-CM | POA: Diagnosis not present

## 2018-11-10 DIAGNOSIS — J22 Unspecified acute lower respiratory infection: Secondary | ICD-10-CM | POA: Diagnosis not present

## 2018-11-10 DIAGNOSIS — E663 Overweight: Secondary | ICD-10-CM | POA: Diagnosis not present

## 2018-11-10 DIAGNOSIS — J209 Acute bronchitis, unspecified: Secondary | ICD-10-CM | POA: Diagnosis not present

## 2018-11-10 DIAGNOSIS — Z6829 Body mass index (BMI) 29.0-29.9, adult: Secondary | ICD-10-CM | POA: Diagnosis not present

## 2018-11-22 IMAGING — DX DG SHOULDER 2+V*L*
3 series · 3 of 3 positions shown · non-contrast
Comparison: 01/20/2009

CLINICAL DATA: Trip and fall yesterday with shoulder pain, initial
encounter

EXAM:
LEFT SHOULDER - 2+ VIEW

[shoulder grashey]
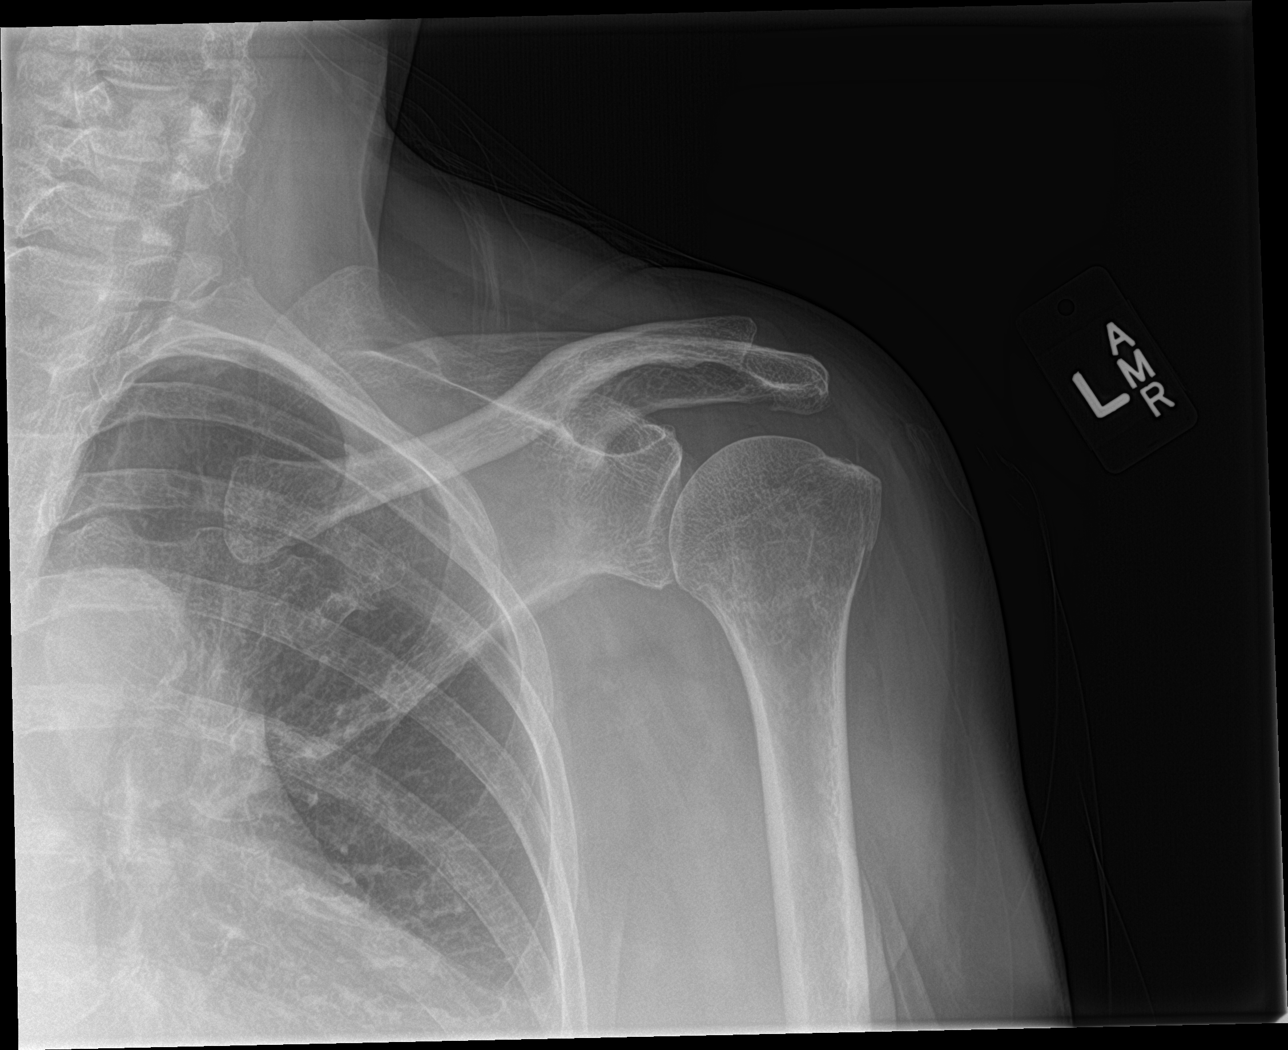

[shoulder y view]
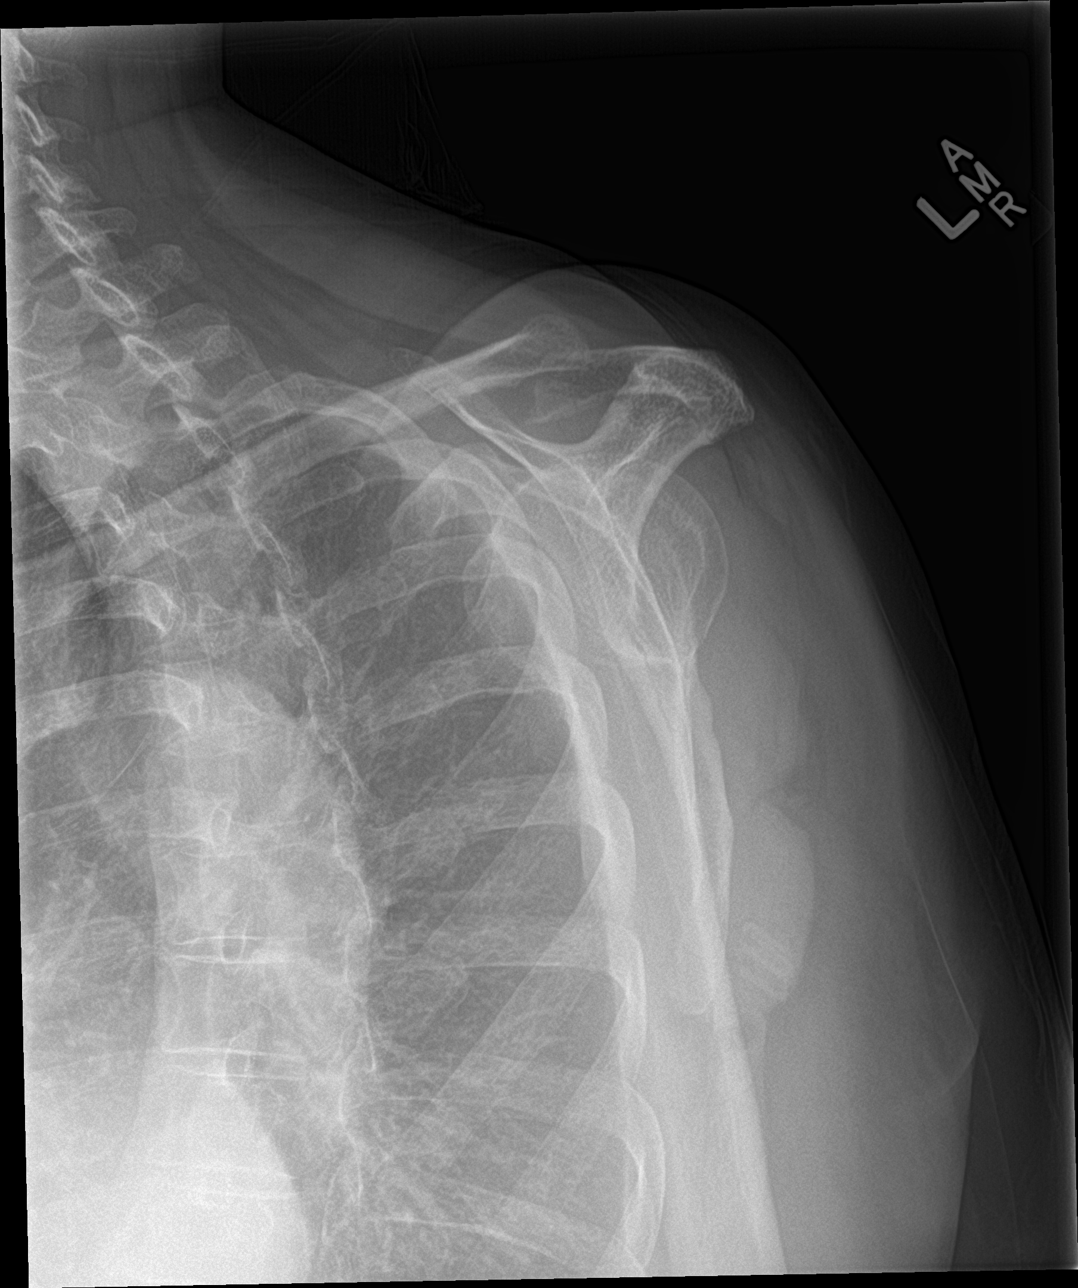

[shoulder axillary]
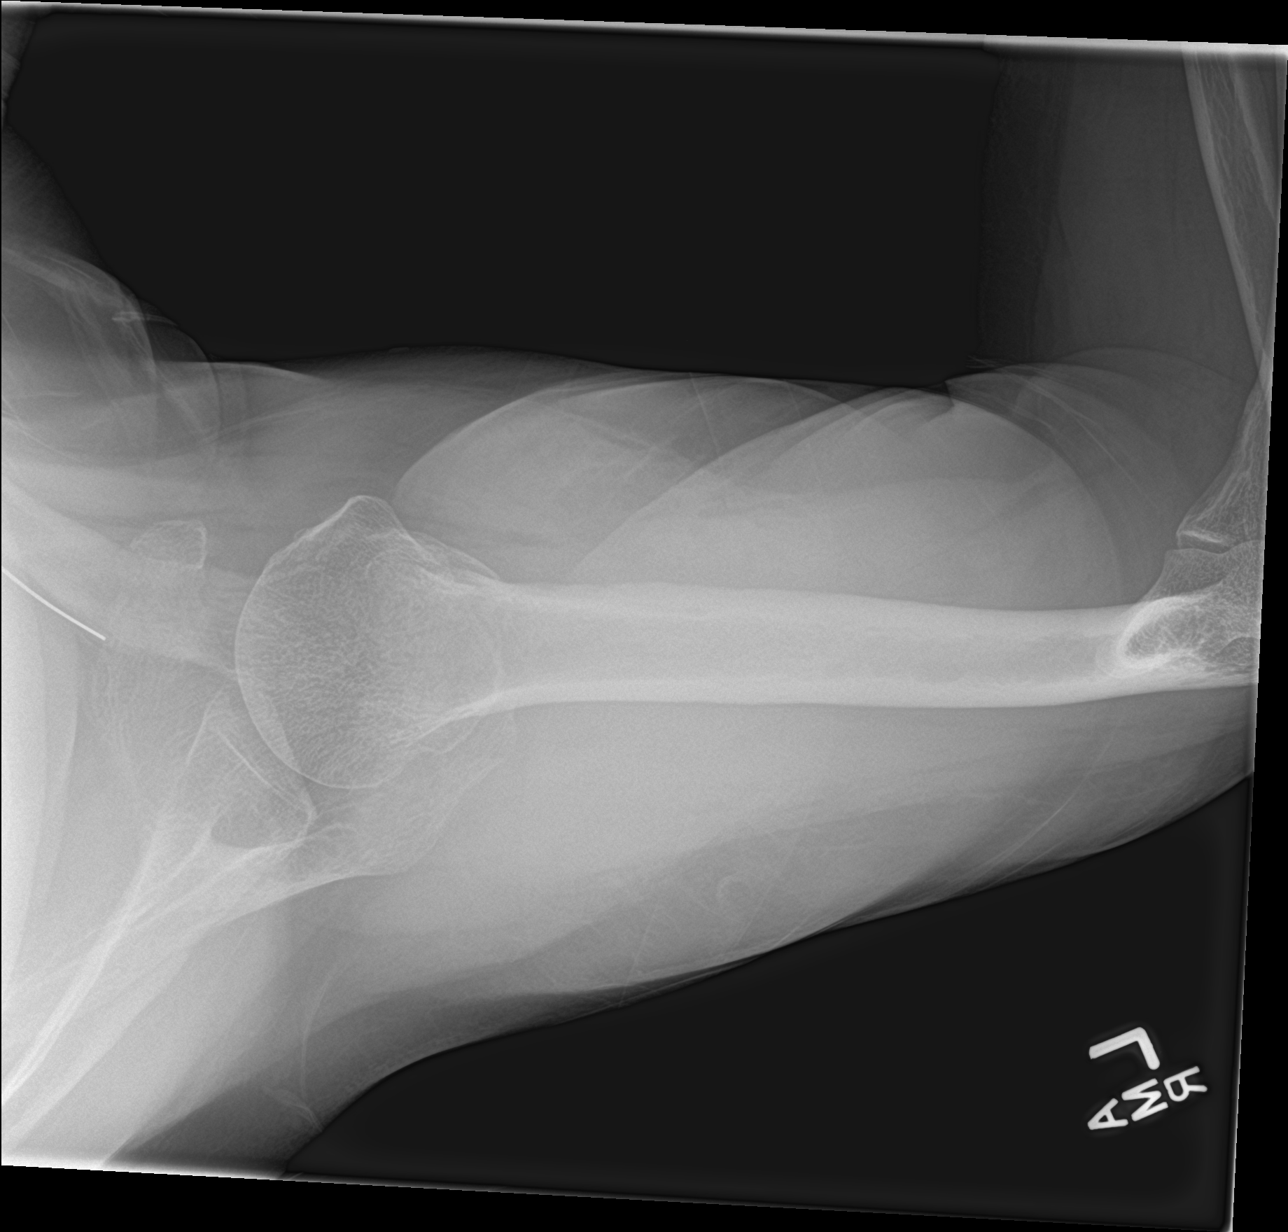

[3 of 3 positions shown; findings below may reference images not displayed]

FINDINGS: There is a fracture through the greater tuberosity without
significant displacement. No dislocation is noted. No other bony
abnormality is seen.
IMPRESSION: Undisplaced fracture of the greater tuberosity.

## 2018-12-21 DIAGNOSIS — Z0000 Encounter for general adult medical examination without abnormal findings: Secondary | ICD-10-CM | POA: Diagnosis not present

## 2018-12-21 DIAGNOSIS — Z1389 Encounter for screening for other disorder: Secondary | ICD-10-CM | POA: Diagnosis not present

## 2018-12-21 DIAGNOSIS — N182 Chronic kidney disease, stage 2 (mild): Secondary | ICD-10-CM | POA: Diagnosis not present

## 2018-12-21 DIAGNOSIS — I10 Essential (primary) hypertension: Secondary | ICD-10-CM | POA: Diagnosis not present

## 2018-12-21 DIAGNOSIS — E663 Overweight: Secondary | ICD-10-CM | POA: Diagnosis not present

## 2018-12-21 DIAGNOSIS — Z0001 Encounter for general adult medical examination with abnormal findings: Secondary | ICD-10-CM | POA: Diagnosis not present

## 2018-12-21 DIAGNOSIS — E119 Type 2 diabetes mellitus without complications: Secondary | ICD-10-CM | POA: Diagnosis not present

## 2018-12-21 DIAGNOSIS — E1129 Type 2 diabetes mellitus with other diabetic kidney complication: Secondary | ICD-10-CM | POA: Diagnosis not present

## 2018-12-21 DIAGNOSIS — Z6828 Body mass index (BMI) 28.0-28.9, adult: Secondary | ICD-10-CM | POA: Diagnosis not present

## 2019-04-24 DIAGNOSIS — E063 Autoimmune thyroiditis: Secondary | ICD-10-CM | POA: Diagnosis not present

## 2019-04-24 DIAGNOSIS — E663 Overweight: Secondary | ICD-10-CM | POA: Diagnosis not present

## 2019-04-24 DIAGNOSIS — E1129 Type 2 diabetes mellitus with other diabetic kidney complication: Secondary | ICD-10-CM | POA: Diagnosis not present

## 2019-04-24 DIAGNOSIS — J329 Chronic sinusitis, unspecified: Secondary | ICD-10-CM | POA: Diagnosis not present

## 2019-04-24 DIAGNOSIS — Z6829 Body mass index (BMI) 29.0-29.9, adult: Secondary | ICD-10-CM | POA: Diagnosis not present

## 2019-04-24 DIAGNOSIS — I10 Essential (primary) hypertension: Secondary | ICD-10-CM | POA: Diagnosis not present

## 2019-04-24 DIAGNOSIS — Z1389 Encounter for screening for other disorder: Secondary | ICD-10-CM | POA: Diagnosis not present

## 2019-04-24 DIAGNOSIS — H6092 Unspecified otitis externa, left ear: Secondary | ICD-10-CM | POA: Diagnosis not present

## 2019-07-31 DIAGNOSIS — E663 Overweight: Secondary | ICD-10-CM | POA: Diagnosis not present

## 2019-07-31 DIAGNOSIS — N182 Chronic kidney disease, stage 2 (mild): Secondary | ICD-10-CM | POA: Diagnosis not present

## 2019-07-31 DIAGNOSIS — Z6829 Body mass index (BMI) 29.0-29.9, adult: Secondary | ICD-10-CM | POA: Diagnosis not present

## 2019-07-31 DIAGNOSIS — Z23 Encounter for immunization: Secondary | ICD-10-CM | POA: Diagnosis not present

## 2019-07-31 DIAGNOSIS — E119 Type 2 diabetes mellitus without complications: Secondary | ICD-10-CM | POA: Diagnosis not present

## 2019-07-31 DIAGNOSIS — I10 Essential (primary) hypertension: Secondary | ICD-10-CM | POA: Diagnosis not present

## 2019-11-21 DIAGNOSIS — Z0000 Encounter for general adult medical examination without abnormal findings: Secondary | ICD-10-CM | POA: Diagnosis not present

## 2019-11-21 DIAGNOSIS — E119 Type 2 diabetes mellitus without complications: Secondary | ICD-10-CM | POA: Diagnosis not present

## 2019-11-21 DIAGNOSIS — Z1389 Encounter for screening for other disorder: Secondary | ICD-10-CM | POA: Diagnosis not present

## 2019-11-21 DIAGNOSIS — E663 Overweight: Secondary | ICD-10-CM | POA: Diagnosis not present

## 2019-11-21 DIAGNOSIS — I10 Essential (primary) hypertension: Secondary | ICD-10-CM | POA: Diagnosis not present

## 2019-11-21 DIAGNOSIS — Z6829 Body mass index (BMI) 29.0-29.9, adult: Secondary | ICD-10-CM | POA: Diagnosis not present

## 2019-11-21 DIAGNOSIS — Z0001 Encounter for general adult medical examination with abnormal findings: Secondary | ICD-10-CM | POA: Diagnosis not present

## 2019-11-21 DIAGNOSIS — R945 Abnormal results of liver function studies: Secondary | ICD-10-CM | POA: Diagnosis not present

## 2019-11-21 DIAGNOSIS — N182 Chronic kidney disease, stage 2 (mild): Secondary | ICD-10-CM | POA: Diagnosis not present

## 2019-11-30 DIAGNOSIS — K8020 Calculus of gallbladder without cholecystitis without obstruction: Secondary | ICD-10-CM | POA: Diagnosis not present

## 2019-11-30 DIAGNOSIS — K760 Fatty (change of) liver, not elsewhere classified: Secondary | ICD-10-CM | POA: Diagnosis not present

## 2020-02-26 DIAGNOSIS — M1991 Primary osteoarthritis, unspecified site: Secondary | ICD-10-CM | POA: Diagnosis not present

## 2020-02-26 DIAGNOSIS — E1129 Type 2 diabetes mellitus with other diabetic kidney complication: Secondary | ICD-10-CM | POA: Diagnosis not present

## 2020-02-26 DIAGNOSIS — I10 Essential (primary) hypertension: Secondary | ICD-10-CM | POA: Diagnosis not present

## 2020-02-26 DIAGNOSIS — Z6828 Body mass index (BMI) 28.0-28.9, adult: Secondary | ICD-10-CM | POA: Diagnosis not present

## 2020-02-26 DIAGNOSIS — E063 Autoimmune thyroiditis: Secondary | ICD-10-CM | POA: Diagnosis not present

## 2020-04-04 ENCOUNTER — Other Ambulatory Visit: Payer: Self-pay

## 2020-04-04 ENCOUNTER — Ambulatory Visit: Payer: PPO | Attending: Internal Medicine

## 2020-04-04 DIAGNOSIS — Z23 Encounter for immunization: Secondary | ICD-10-CM

## 2020-04-04 NOTE — Progress Notes (Signed)
   Covid-19 Vaccination Clinic  Name:  Nina Grant    MRN: [id number] DOB: 1946-09-20  04/04/2020  Nina Grant was observed post Covid-19 immunization for 15 minutes without incident. She was provided with Vaccine Information Sheet and instruction to access the V-Safe system.   Nina Grant was instructed to call 911 with any severe reactions post vaccine: [place] Kitchen Difficulty breathing  . Swelling of face and throat  . A fast heartbeat  . A bad rash all over body  . Dizziness and weakness   Immunizations Administered    Name Date Dose VIS Date Route   Moderna COVID-19 Vaccine 04/04/2020 11:20 AM 0.5 mL 10/2019 Intramuscular   Manufacturer: Moderna   Lot: [id number]   [id number]

## 2020-05-02 ENCOUNTER — Ambulatory Visit: Payer: Self-pay | Attending: Internal Medicine

## 2020-05-02 DIAGNOSIS — Z23 Encounter for immunization: Secondary | ICD-10-CM

## 2020-05-02 NOTE — Progress Notes (Signed)
   Covid-19 Vaccination Clinic  Name:  Nina Grant    MRN: [id number] DOB: Jun 16, 1946  05/02/2020  Nina Grant was observed post Covid-19 immunization for 15 minutes without incident. She was provided with Vaccine Information Sheet and instruction to access the V-Safe system.   Nina Grant was instructed to call 911 with any severe reactions post vaccine: Marland Kitchen Difficulty breathing  . Swelling of face and throat  . A fast heartbeat  . A bad rash all over body  . Dizziness and weakness   Immunizations Administered    Name Date Dose VIS Date Route   Moderna COVID-19 Vaccine 05/02/2020 10:55 AM 0.5 mL 10/2019 Intramuscular   Manufacturer: Moderna   Lot: [id number]   [place]: [id number]

## 2020-05-29 DIAGNOSIS — I10 Essential (primary) hypertension: Secondary | ICD-10-CM | POA: Diagnosis not present

## 2020-05-29 DIAGNOSIS — Z6828 Body mass index (BMI) 28.0-28.9, adult: Secondary | ICD-10-CM | POA: Diagnosis not present

## 2020-05-29 DIAGNOSIS — E1129 Type 2 diabetes mellitus with other diabetic kidney complication: Secondary | ICD-10-CM | POA: Diagnosis not present

## 2020-05-29 DIAGNOSIS — E663 Overweight: Secondary | ICD-10-CM | POA: Diagnosis not present

## 2020-05-29 DIAGNOSIS — E063 Autoimmune thyroiditis: Secondary | ICD-10-CM | POA: Diagnosis not present

## 2020-05-29 DIAGNOSIS — J309 Allergic rhinitis, unspecified: Secondary | ICD-10-CM | POA: Diagnosis not present

## 2020-05-29 DIAGNOSIS — M1991 Primary osteoarthritis, unspecified site: Secondary | ICD-10-CM | POA: Diagnosis not present

## 2020-06-04 DIAGNOSIS — E119 Type 2 diabetes mellitus without complications: Secondary | ICD-10-CM | POA: Diagnosis not present

## 2020-09-02 DIAGNOSIS — Z6829 Body mass index (BMI) 29.0-29.9, adult: Secondary | ICD-10-CM | POA: Diagnosis not present

## 2020-09-02 DIAGNOSIS — N182 Chronic kidney disease, stage 2 (mild): Secondary | ICD-10-CM | POA: Diagnosis not present

## 2020-09-02 DIAGNOSIS — E1129 Type 2 diabetes mellitus with other diabetic kidney complication: Secondary | ICD-10-CM | POA: Diagnosis not present

## 2020-09-02 DIAGNOSIS — Z23 Encounter for immunization: Secondary | ICD-10-CM | POA: Diagnosis not present

## 2020-09-02 DIAGNOSIS — E063 Autoimmune thyroiditis: Secondary | ICD-10-CM | POA: Diagnosis not present

## 2020-09-02 DIAGNOSIS — I10 Essential (primary) hypertension: Secondary | ICD-10-CM | POA: Diagnosis not present

## 2020-12-05 DIAGNOSIS — E063 Autoimmune thyroiditis: Secondary | ICD-10-CM | POA: Diagnosis not present

## 2020-12-05 DIAGNOSIS — Z6829 Body mass index (BMI) 29.0-29.9, adult: Secondary | ICD-10-CM | POA: Diagnosis not present

## 2020-12-05 DIAGNOSIS — E663 Overweight: Secondary | ICD-10-CM | POA: Diagnosis not present

## 2020-12-05 DIAGNOSIS — E119 Type 2 diabetes mellitus without complications: Secondary | ICD-10-CM | POA: Diagnosis not present

## 2020-12-05 DIAGNOSIS — E1129 Type 2 diabetes mellitus with other diabetic kidney complication: Secondary | ICD-10-CM | POA: Diagnosis not present

## 2020-12-05 DIAGNOSIS — Z1389 Encounter for screening for other disorder: Secondary | ICD-10-CM | POA: Diagnosis not present

## 2020-12-05 DIAGNOSIS — Z0000 Encounter for general adult medical examination without abnormal findings: Secondary | ICD-10-CM | POA: Diagnosis not present

## 2020-12-05 DIAGNOSIS — N182 Chronic kidney disease, stage 2 (mild): Secondary | ICD-10-CM | POA: Diagnosis not present

## 2020-12-05 DIAGNOSIS — Z1331 Encounter for screening for depression: Secondary | ICD-10-CM | POA: Diagnosis not present

## 2020-12-05 DIAGNOSIS — I10 Essential (primary) hypertension: Secondary | ICD-10-CM | POA: Diagnosis not present

## 2020-12-09 DIAGNOSIS — Z0000 Encounter for general adult medical examination without abnormal findings: Secondary | ICD-10-CM | POA: Diagnosis not present

## 2020-12-09 DIAGNOSIS — E663 Overweight: Secondary | ICD-10-CM | POA: Diagnosis not present

## 2020-12-09 DIAGNOSIS — Z1389 Encounter for screening for other disorder: Secondary | ICD-10-CM | POA: Diagnosis not present

## 2020-12-09 DIAGNOSIS — Z6829 Body mass index (BMI) 29.0-29.9, adult: Secondary | ICD-10-CM | POA: Diagnosis not present

## 2021-03-03 ENCOUNTER — Other Ambulatory Visit (HOSPITAL_COMMUNITY): Payer: Self-pay | Admitting: Internal Medicine

## 2021-03-03 ENCOUNTER — Ambulatory Visit (HOSPITAL_COMMUNITY)
Admission: RE | Admit: 2021-03-03 | Discharge: 2021-03-03 | Disposition: A | Discharge status: Home / Self Care | Payer: PPO | Source: Ambulatory Visit | Attending: Internal Medicine | Admitting: Internal Medicine

## 2021-03-03 DIAGNOSIS — Z1231 Encounter for screening mammogram for malignant neoplasm of breast: Secondary | ICD-10-CM

## 2021-03-11 DIAGNOSIS — E1129 Type 2 diabetes mellitus with other diabetic kidney complication: Secondary | ICD-10-CM | POA: Diagnosis not present

## 2021-03-11 DIAGNOSIS — Z6829 Body mass index (BMI) 29.0-29.9, adult: Secondary | ICD-10-CM | POA: Diagnosis not present

## 2021-03-11 DIAGNOSIS — I10 Essential (primary) hypertension: Secondary | ICD-10-CM | POA: Diagnosis not present

## 2021-03-11 DIAGNOSIS — E663 Overweight: Secondary | ICD-10-CM | POA: Diagnosis not present

## 2021-06-13 DIAGNOSIS — Z6828 Body mass index (BMI) 28.0-28.9, adult: Secondary | ICD-10-CM | POA: Diagnosis not present

## 2021-06-13 DIAGNOSIS — N189 Chronic kidney disease, unspecified: Secondary | ICD-10-CM | POA: Diagnosis not present

## 2021-06-13 DIAGNOSIS — I10 Essential (primary) hypertension: Secondary | ICD-10-CM | POA: Diagnosis not present

## 2021-06-13 DIAGNOSIS — E663 Overweight: Secondary | ICD-10-CM | POA: Diagnosis not present

## 2021-06-13 DIAGNOSIS — E063 Autoimmune thyroiditis: Secondary | ICD-10-CM | POA: Diagnosis not present

## 2021-06-13 DIAGNOSIS — E1129 Type 2 diabetes mellitus with other diabetic kidney complication: Secondary | ICD-10-CM | POA: Diagnosis not present

## 2021-09-15 DIAGNOSIS — M1991 Primary osteoarthritis, unspecified site: Secondary | ICD-10-CM | POA: Diagnosis not present

## 2021-09-15 DIAGNOSIS — Z6827 Body mass index (BMI) 27.0-27.9, adult: Secondary | ICD-10-CM | POA: Diagnosis not present

## 2021-09-15 DIAGNOSIS — E1122 Type 2 diabetes mellitus with diabetic chronic kidney disease: Secondary | ICD-10-CM | POA: Diagnosis not present

## 2021-09-15 DIAGNOSIS — E663 Overweight: Secondary | ICD-10-CM | POA: Diagnosis not present

## 2021-09-15 DIAGNOSIS — I10 Essential (primary) hypertension: Secondary | ICD-10-CM | POA: Diagnosis not present

## 2021-09-15 DIAGNOSIS — Z23 Encounter for immunization: Secondary | ICD-10-CM | POA: Diagnosis not present

## 2021-09-15 DIAGNOSIS — E063 Autoimmune thyroiditis: Secondary | ICD-10-CM | POA: Diagnosis not present

## 2021-09-30 DIAGNOSIS — E2839 Other primary ovarian failure: Secondary | ICD-10-CM | POA: Diagnosis not present

## 2021-09-30 DIAGNOSIS — M8589 Other specified disorders of bone density and structure, multiple sites: Secondary | ICD-10-CM | POA: Diagnosis not present

## 2021-12-11 DIAGNOSIS — I10 Essential (primary) hypertension: Secondary | ICD-10-CM | POA: Diagnosis not present

## 2021-12-11 DIAGNOSIS — Z8673 Personal history of transient ischemic attack (TIA), and cerebral infarction without residual deficits: Secondary | ICD-10-CM | POA: Diagnosis not present

## 2021-12-11 DIAGNOSIS — Z1331 Encounter for screening for depression: Secondary | ICD-10-CM | POA: Diagnosis not present

## 2021-12-11 DIAGNOSIS — E1129 Type 2 diabetes mellitus with other diabetic kidney complication: Secondary | ICD-10-CM | POA: Diagnosis not present

## 2021-12-11 DIAGNOSIS — Z0000 Encounter for general adult medical examination without abnormal findings: Secondary | ICD-10-CM | POA: Diagnosis not present

## 2021-12-11 DIAGNOSIS — E063 Autoimmune thyroiditis: Secondary | ICD-10-CM | POA: Diagnosis not present

## 2021-12-11 DIAGNOSIS — M1991 Primary osteoarthritis, unspecified site: Secondary | ICD-10-CM | POA: Diagnosis not present

## 2021-12-11 DIAGNOSIS — E663 Overweight: Secondary | ICD-10-CM | POA: Diagnosis not present

## 2021-12-11 DIAGNOSIS — N182 Chronic kidney disease, stage 2 (mild): Secondary | ICD-10-CM | POA: Diagnosis not present

## 2021-12-11 DIAGNOSIS — Z6827 Body mass index (BMI) 27.0-27.9, adult: Secondary | ICD-10-CM | POA: Diagnosis not present

## 2021-12-12 DIAGNOSIS — Z0001 Encounter for general adult medical examination with abnormal findings: Secondary | ICD-10-CM | POA: Diagnosis not present

## 2021-12-12 DIAGNOSIS — N182 Chronic kidney disease, stage 2 (mild): Secondary | ICD-10-CM | POA: Diagnosis not present

## 2021-12-12 DIAGNOSIS — I10 Essential (primary) hypertension: Secondary | ICD-10-CM | POA: Diagnosis not present

## 2021-12-12 DIAGNOSIS — E1129 Type 2 diabetes mellitus with other diabetic kidney complication: Secondary | ICD-10-CM | POA: Diagnosis not present

## 2022-03-13 DIAGNOSIS — E1129 Type 2 diabetes mellitus with other diabetic kidney complication: Secondary | ICD-10-CM | POA: Diagnosis not present

## 2022-03-13 DIAGNOSIS — I10 Essential (primary) hypertension: Secondary | ICD-10-CM | POA: Diagnosis not present

## 2022-03-13 DIAGNOSIS — E663 Overweight: Secondary | ICD-10-CM | POA: Diagnosis not present

## 2022-03-13 DIAGNOSIS — M1991 Primary osteoarthritis, unspecified site: Secondary | ICD-10-CM | POA: Diagnosis not present

## 2022-03-13 DIAGNOSIS — N183 Chronic kidney disease, stage 3 unspecified: Secondary | ICD-10-CM | POA: Diagnosis not present

## 2022-03-13 DIAGNOSIS — E063 Autoimmune thyroiditis: Secondary | ICD-10-CM | POA: Diagnosis not present

## 2022-03-13 DIAGNOSIS — Z6827 Body mass index (BMI) 27.0-27.9, adult: Secondary | ICD-10-CM | POA: Diagnosis not present

## 2022-06-15 DIAGNOSIS — E1122 Type 2 diabetes mellitus with diabetic chronic kidney disease: Secondary | ICD-10-CM | POA: Diagnosis not present

## 2022-06-15 DIAGNOSIS — J302 Other seasonal allergic rhinitis: Secondary | ICD-10-CM | POA: Diagnosis not present

## 2022-06-15 DIAGNOSIS — Z6828 Body mass index (BMI) 28.0-28.9, adult: Secondary | ICD-10-CM | POA: Diagnosis not present

## 2022-06-15 DIAGNOSIS — I10 Essential (primary) hypertension: Secondary | ICD-10-CM | POA: Diagnosis not present

## 2022-06-15 DIAGNOSIS — F411 Generalized anxiety disorder: Secondary | ICD-10-CM | POA: Diagnosis not present

## 2022-06-15 DIAGNOSIS — N183 Chronic kidney disease, stage 3 unspecified: Secondary | ICD-10-CM | POA: Diagnosis not present

## 2022-06-15 DIAGNOSIS — E1129 Type 2 diabetes mellitus with other diabetic kidney complication: Secondary | ICD-10-CM | POA: Diagnosis not present

## 2022-09-10 DIAGNOSIS — E6609 Other obesity due to excess calories: Secondary | ICD-10-CM | POA: Diagnosis not present

## 2022-09-10 DIAGNOSIS — F411 Generalized anxiety disorder: Secondary | ICD-10-CM | POA: Diagnosis not present

## 2022-09-10 DIAGNOSIS — Z6828 Body mass index (BMI) 28.0-28.9, adult: Secondary | ICD-10-CM | POA: Diagnosis not present

## 2022-09-10 DIAGNOSIS — E1122 Type 2 diabetes mellitus with diabetic chronic kidney disease: Secondary | ICD-10-CM | POA: Diagnosis not present

## 2022-09-10 DIAGNOSIS — I10 Essential (primary) hypertension: Secondary | ICD-10-CM | POA: Diagnosis not present

## 2022-09-10 DIAGNOSIS — N183 Chronic kidney disease, stage 3 unspecified: Secondary | ICD-10-CM | POA: Diagnosis not present

## 2022-09-10 DIAGNOSIS — E1129 Type 2 diabetes mellitus with other diabetic kidney complication: Secondary | ICD-10-CM | POA: Diagnosis not present

## 2022-09-10 DIAGNOSIS — M1991 Primary osteoarthritis, unspecified site: Secondary | ICD-10-CM | POA: Diagnosis not present

## 2022-09-10 DIAGNOSIS — Z23 Encounter for immunization: Secondary | ICD-10-CM | POA: Diagnosis not present

## 2022-12-21 DIAGNOSIS — E039 Hypothyroidism, unspecified: Secondary | ICD-10-CM | POA: Diagnosis not present

## 2022-12-21 DIAGNOSIS — E1129 Type 2 diabetes mellitus with other diabetic kidney complication: Secondary | ICD-10-CM | POA: Diagnosis not present

## 2022-12-21 DIAGNOSIS — E663 Overweight: Secondary | ICD-10-CM | POA: Diagnosis not present

## 2022-12-21 DIAGNOSIS — D518 Other vitamin B12 deficiency anemias: Secondary | ICD-10-CM | POA: Diagnosis not present

## 2022-12-21 DIAGNOSIS — M1991 Primary osteoarthritis, unspecified site: Secondary | ICD-10-CM | POA: Diagnosis not present

## 2022-12-21 DIAGNOSIS — I10 Essential (primary) hypertension: Secondary | ICD-10-CM | POA: Diagnosis not present

## 2022-12-21 DIAGNOSIS — E559 Vitamin D deficiency, unspecified: Secondary | ICD-10-CM | POA: Diagnosis not present

## 2022-12-21 DIAGNOSIS — N1831 Chronic kidney disease, stage 3a: Secondary | ICD-10-CM | POA: Diagnosis not present

## 2022-12-21 DIAGNOSIS — Z6828 Body mass index (BMI) 28.0-28.9, adult: Secondary | ICD-10-CM | POA: Diagnosis not present

## 2022-12-21 DIAGNOSIS — Z1331 Encounter for screening for depression: Secondary | ICD-10-CM | POA: Diagnosis not present

## 2022-12-21 DIAGNOSIS — Z0001 Encounter for general adult medical examination with abnormal findings: Secondary | ICD-10-CM | POA: Diagnosis not present

## 2022-12-21 DIAGNOSIS — Z0000 Encounter for general adult medical examination without abnormal findings: Secondary | ICD-10-CM | POA: Diagnosis not present

## 2022-12-21 DIAGNOSIS — E1122 Type 2 diabetes mellitus with diabetic chronic kidney disease: Secondary | ICD-10-CM | POA: Diagnosis not present

## 2023-03-24 DIAGNOSIS — E663 Overweight: Secondary | ICD-10-CM | POA: Diagnosis not present

## 2023-03-24 DIAGNOSIS — Z6828 Body mass index (BMI) 28.0-28.9, adult: Secondary | ICD-10-CM | POA: Diagnosis not present

## 2023-03-24 DIAGNOSIS — I10 Essential (primary) hypertension: Secondary | ICD-10-CM | POA: Diagnosis not present

## 2023-03-24 DIAGNOSIS — E1129 Type 2 diabetes mellitus with other diabetic kidney complication: Secondary | ICD-10-CM | POA: Diagnosis not present

## 2023-03-24 DIAGNOSIS — N1831 Chronic kidney disease, stage 3a: Secondary | ICD-10-CM | POA: Diagnosis not present

## 2023-03-24 DIAGNOSIS — M1991 Primary osteoarthritis, unspecified site: Secondary | ICD-10-CM | POA: Diagnosis not present

## 2023-04-02 IMAGING — MG MM DIGITAL SCREENING BILAT W/ TOMO AND CAD
6 of 10 series · 6 of 30 positions shown · non-contrast
Comparison: Previous exam(s).

CLINICAL DATA: Screening.

EXAM:
DIGITAL SCREENING BILATERAL MAMMOGRAM WITH TOMOSYNTHESIS AND CAD
TECHNIQUE: Bilateral screening digital craniocaudal and mediolateral oblique
mammograms were obtained. Bilateral screening digital breast
tomosynthesis was performed. The images were evaluated with
computer-aided detection.

[L CC synth-2D]
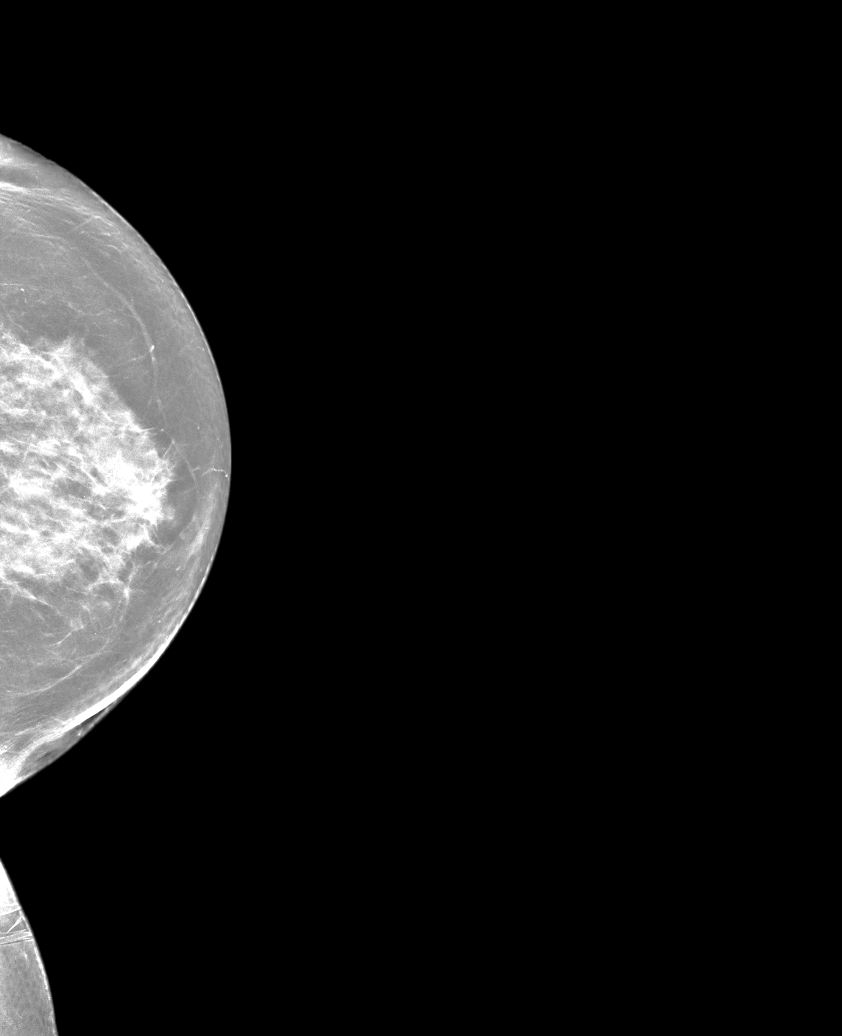

[R MLO synth-2D (1 of 2)]
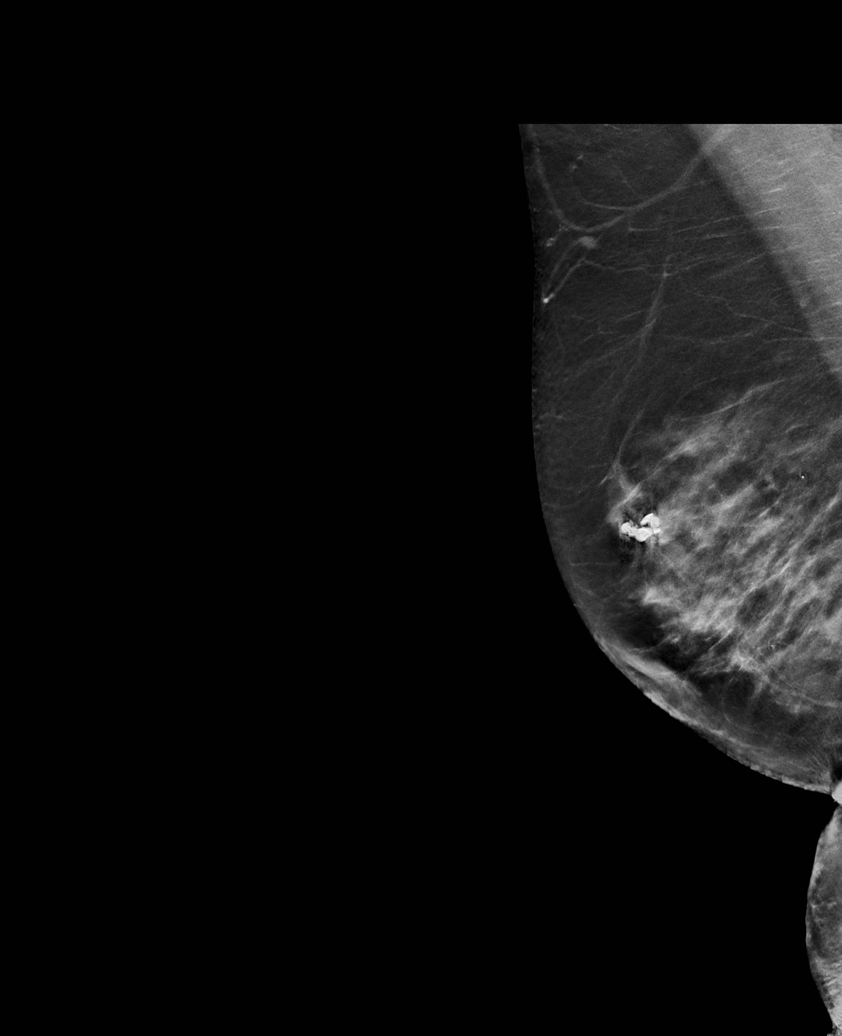

[L MLO synth-2D]
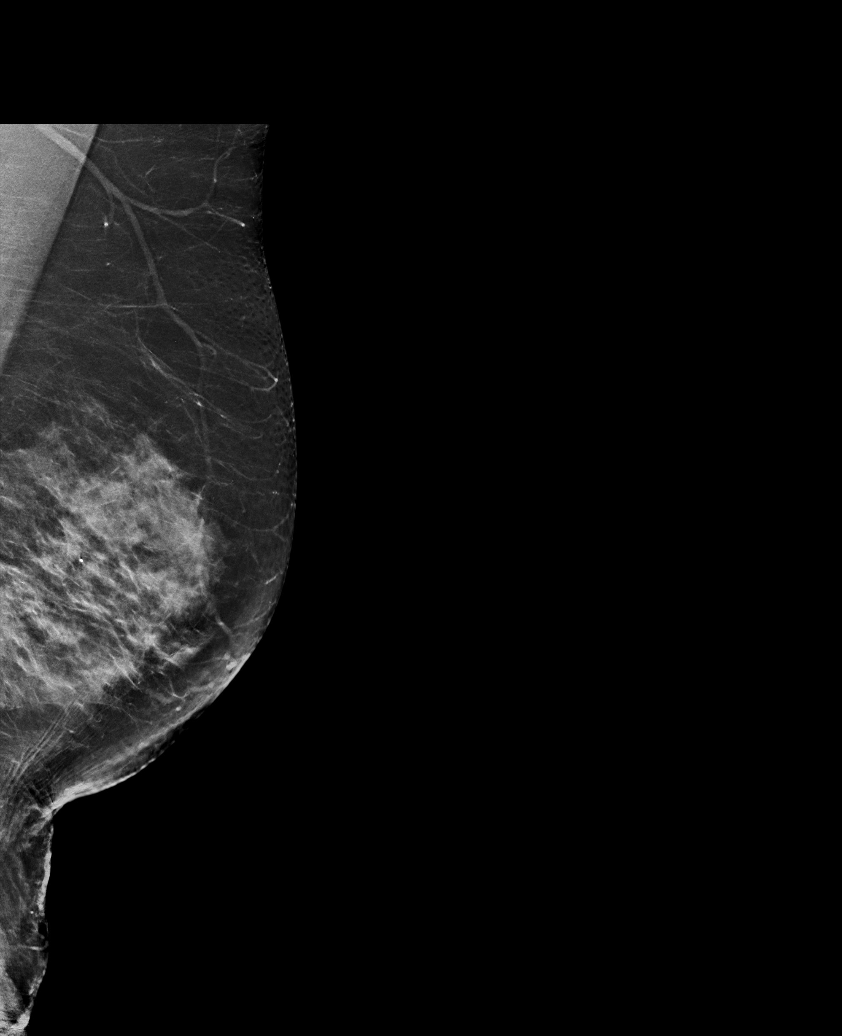

[R MLO synth-2D (2 of 2)]
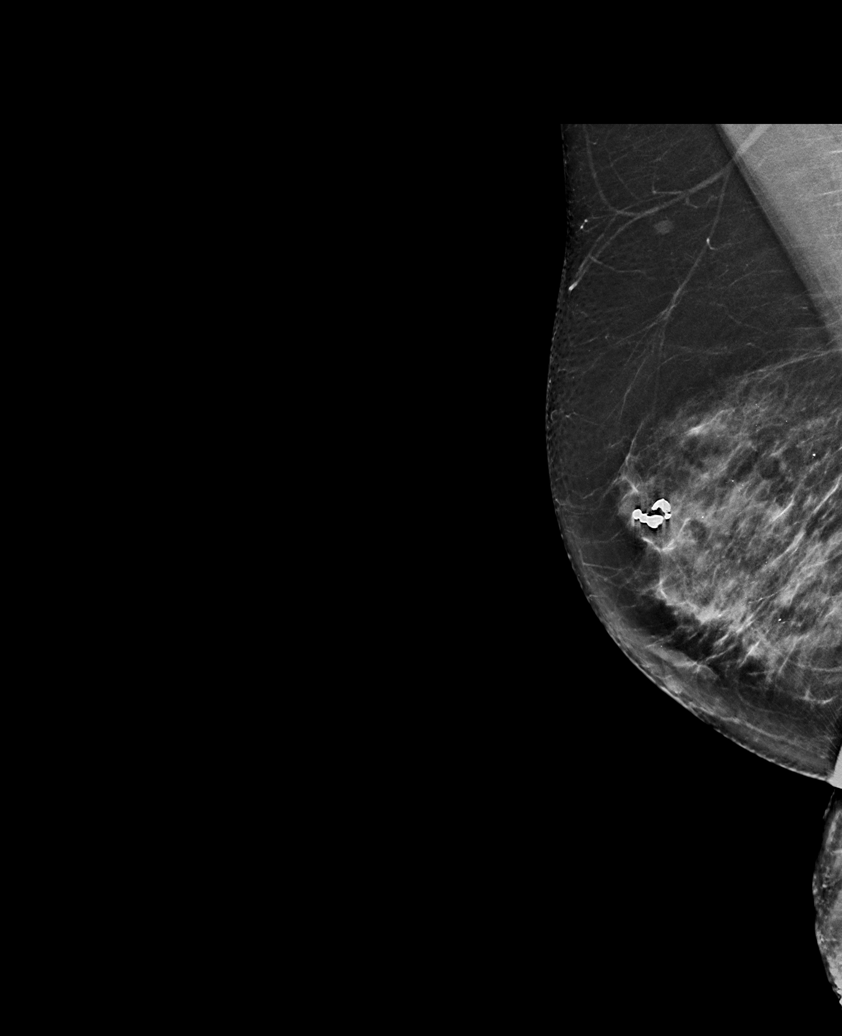

[R CC synth-2D]
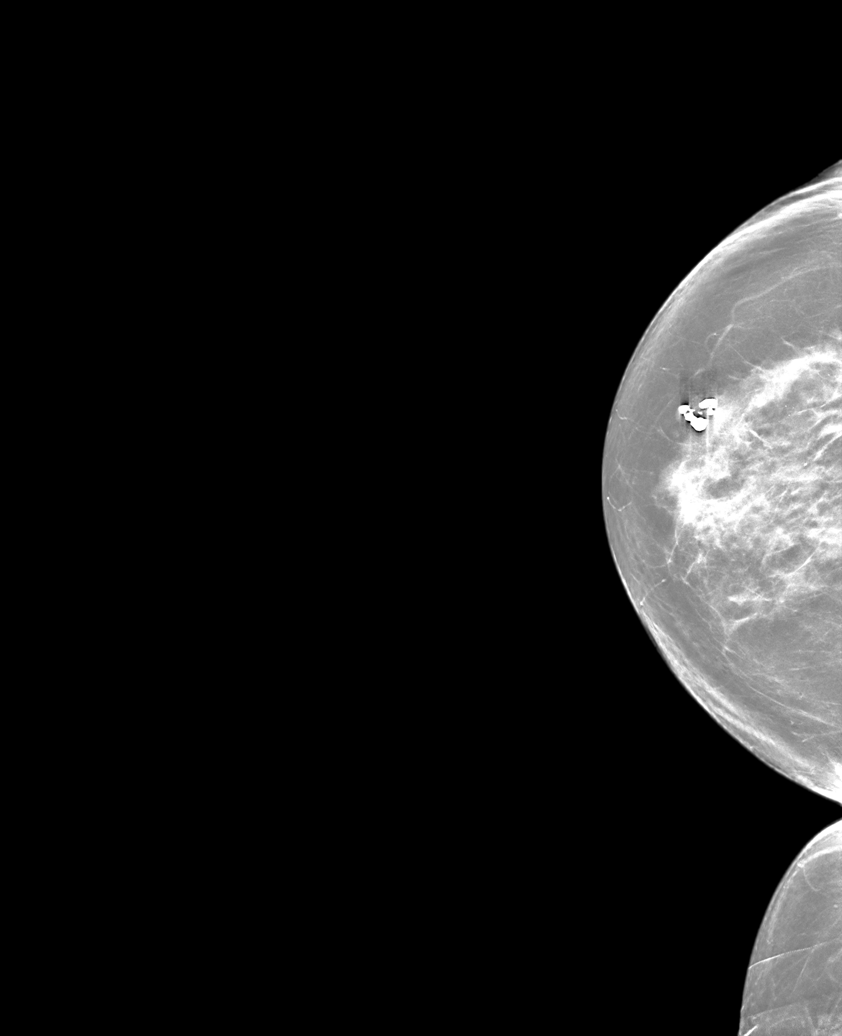

[L MLO tomo · tomo slice 36/71.0]
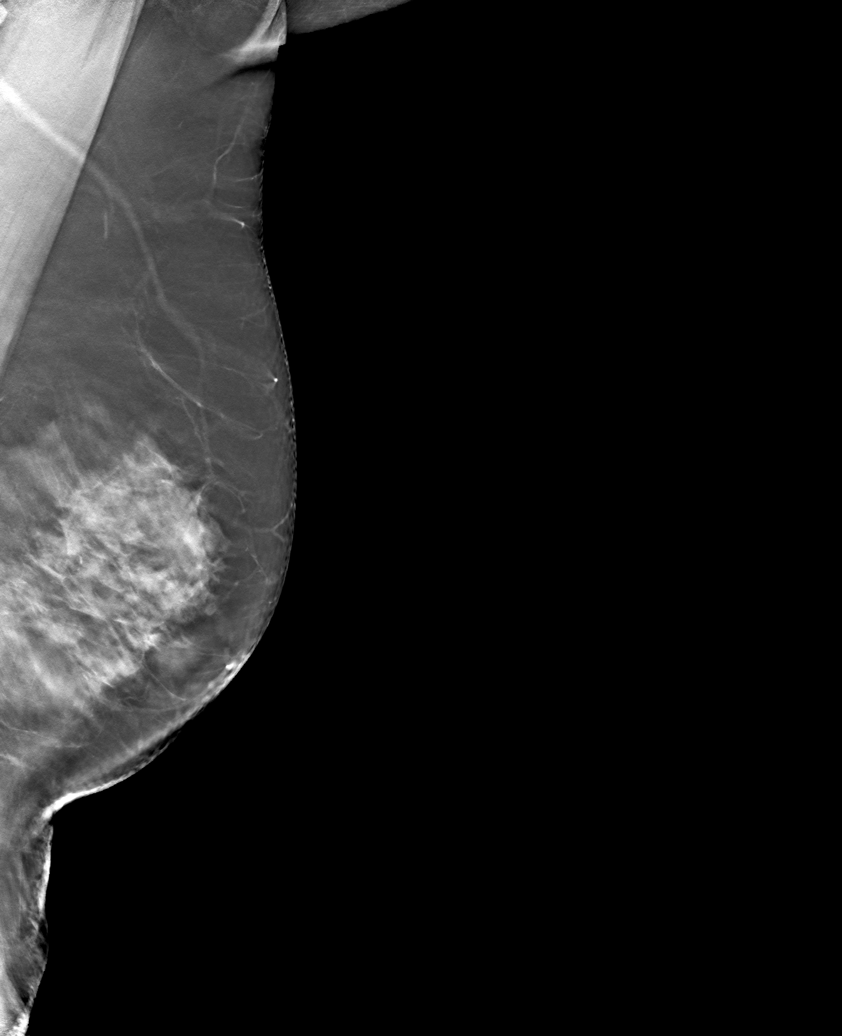

[6 of 30 positions shown; findings below may reference images not displayed]

ACR Breast Density Category c: The breast tissue is heterogeneously
dense, which may obscure small masses.
FINDINGS: There are no findings suspicious for malignancy. The images were
evaluated with computer-aided detection.
IMPRESSION: No mammographic evidence of malignancy. A result letter of this
screening mammogram will be mailed directly to the patient.

RECOMMENDATION:
Screening mammogram in one year. (Code:T4-5-GWO)

BI-RADS CATEGORY  1: Negative.

## 2023-09-27 DIAGNOSIS — E1129 Type 2 diabetes mellitus with other diabetic kidney complication: Secondary | ICD-10-CM | POA: Diagnosis not present

## 2023-09-27 DIAGNOSIS — M1991 Primary osteoarthritis, unspecified site: Secondary | ICD-10-CM | POA: Diagnosis not present

## 2023-09-27 DIAGNOSIS — N1831 Chronic kidney disease, stage 3a: Secondary | ICD-10-CM | POA: Diagnosis not present

## 2023-09-27 DIAGNOSIS — E663 Overweight: Secondary | ICD-10-CM | POA: Diagnosis not present

## 2023-09-27 DIAGNOSIS — E1122 Type 2 diabetes mellitus with diabetic chronic kidney disease: Secondary | ICD-10-CM | POA: Diagnosis not present

## 2023-09-27 DIAGNOSIS — I10 Essential (primary) hypertension: Secondary | ICD-10-CM | POA: Diagnosis not present

## 2023-09-27 DIAGNOSIS — Z6828 Body mass index (BMI) 28.0-28.9, adult: Secondary | ICD-10-CM | POA: Diagnosis not present

## 2023-11-29 DIAGNOSIS — Z1331 Encounter for screening for depression: Secondary | ICD-10-CM | POA: Diagnosis not present

## 2023-11-29 DIAGNOSIS — J302 Other seasonal allergic rhinitis: Secondary | ICD-10-CM | POA: Diagnosis not present

## 2023-11-29 DIAGNOSIS — E559 Vitamin D deficiency, unspecified: Secondary | ICD-10-CM | POA: Diagnosis not present

## 2023-11-29 DIAGNOSIS — I10 Essential (primary) hypertension: Secondary | ICD-10-CM | POA: Diagnosis not present

## 2023-11-29 DIAGNOSIS — Z6829 Body mass index (BMI) 29.0-29.9, adult: Secondary | ICD-10-CM | POA: Diagnosis not present

## 2023-11-29 DIAGNOSIS — E039 Hypothyroidism, unspecified: Secondary | ICD-10-CM | POA: Diagnosis not present

## 2023-11-29 DIAGNOSIS — Z0001 Encounter for general adult medical examination with abnormal findings: Secondary | ICD-10-CM | POA: Diagnosis not present

## 2023-11-29 DIAGNOSIS — N1831 Chronic kidney disease, stage 3a: Secondary | ICD-10-CM | POA: Diagnosis not present

## 2023-11-29 DIAGNOSIS — E1122 Type 2 diabetes mellitus with diabetic chronic kidney disease: Secondary | ICD-10-CM | POA: Diagnosis not present

## 2023-11-29 DIAGNOSIS — M1991 Primary osteoarthritis, unspecified site: Secondary | ICD-10-CM | POA: Diagnosis not present

## 2023-11-29 DIAGNOSIS — D518 Other vitamin B12 deficiency anemias: Secondary | ICD-10-CM | POA: Diagnosis not present

## 2023-11-29 DIAGNOSIS — E1129 Type 2 diabetes mellitus with other diabetic kidney complication: Secondary | ICD-10-CM | POA: Diagnosis not present

## 2023-11-29 DIAGNOSIS — E6609 Other obesity due to excess calories: Secondary | ICD-10-CM | POA: Diagnosis not present

## 2024-03-27 DIAGNOSIS — E663 Overweight: Secondary | ICD-10-CM | POA: Diagnosis not present

## 2024-03-27 DIAGNOSIS — N1831 Chronic kidney disease, stage 3a: Secondary | ICD-10-CM | POA: Diagnosis not present

## 2024-03-27 DIAGNOSIS — I10 Essential (primary) hypertension: Secondary | ICD-10-CM | POA: Diagnosis not present

## 2024-03-27 DIAGNOSIS — Z6829 Body mass index (BMI) 29.0-29.9, adult: Secondary | ICD-10-CM | POA: Diagnosis not present

## 2024-03-27 DIAGNOSIS — E1129 Type 2 diabetes mellitus with other diabetic kidney complication: Secondary | ICD-10-CM | POA: Diagnosis not present

## 2024-06-12 DIAGNOSIS — E119 Type 2 diabetes mellitus without complications: Secondary | ICD-10-CM | POA: Diagnosis not present

## 2024-07-27 ENCOUNTER — Ambulatory Visit

## 2024-07-27 VITALS — BP 123/73 | HR 70 | Ht 63.0 in | Wt 166.0 lb

## 2024-07-27 DIAGNOSIS — E1169 Type 2 diabetes mellitus with other specified complication: Secondary | ICD-10-CM | POA: Insufficient documentation

## 2024-07-27 DIAGNOSIS — N183 Chronic kidney disease, stage 3 unspecified: Secondary | ICD-10-CM | POA: Insufficient documentation

## 2024-07-27 DIAGNOSIS — Z6829 Body mass index (BMI) 29.0-29.9, adult: Secondary | ICD-10-CM

## 2024-07-27 DIAGNOSIS — I152 Hypertension secondary to endocrine disorders: Secondary | ICD-10-CM

## 2024-07-27 DIAGNOSIS — J309 Allergic rhinitis, unspecified: Secondary | ICD-10-CM | POA: Insufficient documentation

## 2024-07-27 DIAGNOSIS — E1140 Type 2 diabetes mellitus with diabetic neuropathy, unspecified: Secondary | ICD-10-CM | POA: Diagnosis not present

## 2024-07-27 DIAGNOSIS — F419 Anxiety disorder, unspecified: Secondary | ICD-10-CM | POA: Insufficient documentation

## 2024-07-27 DIAGNOSIS — Z23 Encounter for immunization: Secondary | ICD-10-CM | POA: Diagnosis not present

## 2024-07-27 DIAGNOSIS — E1159 Type 2 diabetes mellitus with other circulatory complications: Secondary | ICD-10-CM

## 2024-07-27 DIAGNOSIS — F411 Generalized anxiety disorder: Secondary | ICD-10-CM

## 2024-07-27 NOTE — Progress Notes (Signed)
 New Patient Office Visit  Subjective    Patient ID: Nina Grant, female    DOB: 1946/08/24  Age: 78 y.o. MRN: [id number]  CC:  Chief Complaint  Patient presents with   Establish Care    HPI Nina Grant presents to establish care  Discussed the use of AI scribe software for clinical note transcription with the patient, who gave verbal consent to proceed.  History of Present Illness   Nina Grant is a 78 year old female with borderline diabetes and neuropathy who presents for a routine follow-up visit.  Glycemic variability - Blood glucose levels fluctuate, with both hyperglycemic and hypoglycemic episodes - Currently takes glipizide 2 mg for diabetes management - Previously took metformin - Recent dietary changes, including attending three birthday parties, have contributed to blood sugar variability - Described as 'borderline diabetic' by previous provider  Peripheral neuropathy - Experiences neuropathic symptoms described as needles and bee stings in feet and legs, particularly at night - Symptoms began after walking down her driveway and later occurred upon waking - Manages symptoms with topical treatments and leg elevation to prevent swelling - Takes a B-complex vitamin with calcium  Allergic rhinitis - Takes Singulair at bedtime for allergy management - Uses Flonase as needed for allergy symptoms  Anxiety - Takes Xanax 0.25 mg for anxiety - Anxiety attributed to significant personal losses, including the death of her husband in Aug 16, 2020, stepdaughter, and other family members  Immunization status and infectious disease precautions - Received influenza vaccination today; missed last year's vaccine due to availability issues - No history of COVID-19 infection - Practices caution regarding COVID-19 exposure, 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   atorvastatin (LIPITOR) 10 MG tablet Take 10 mg by mouth daily.   calcium-vitamin D (OSCAL WITH D) 500-5 MG-MCG tablet Take 1 tablet by mouth daily with breakfast.   co-enzyme Q-10 30 MG capsule Take 100 mg by mouth daily.   Flaxseed, Linseed, (FLAXSEED OIL) 1200 MG CAPS Take 1,200 mg by mouth 3 (three) times daily.   fluticasone (FLONASE) 50 MCG/ACT nasal spray Place 1 spray into both nostrils 2 (two) times daily.   gabapentin (NEURONTIN) 100 MG capsule Take 100 mg by mouth 3 (three) times daily.   glimepiride (AMARYL) 2 MG tablet Take 2 mg by mouth daily.   lisinopril (ZESTRIL) 2.5 MG tablet Take 2.5 mg by mouth daily.   montelukast (SINGULAIR) 10 MG tablet Take 10 mg by mouth at bedtime.   aspirin 325 MG tablet Take 325 mg by mouth daily.   metoprolol tartrate (LOPRESSOR) 25 MG tablet Take 25 mg by mouth 2 (two) times daily.    [DISCONTINUED] metFORMIN (GLUCOPHAGE-XR) 500 MG 24 hr tablet Take 500 mg by mouth at bedtime.    No facility-administered encounter medications on file as of 07/27/2024.    Past Medical History:  Diagnosis Date   Diverticula of colon    DM (diabetes mellitus) (HCC)    Hyperplastic colon polyp 02/23/2007   Hypertension    Stroke [facility])    Tubular adenoma of colon 02/23/2007   Vertigo     Past Surgical History:  Procedure Laterality Date   COLONOSCOPY  02/23/2007   Dr. Shaaron- tubular adenoma, hyperplastic polyp, diverticula   COLONOSCOPY  04/28/2012   Procedure: COLONOSCOPY;  Surgeon: Lamar CHRISTELLA Hollingshead, MD;  Location: [facility];  Service: Endoscopy;  Laterality: N/A;  8:30    Family History  Problem Relation Age of Onset   Colon cancer Sister 58    Social History   Socioeconomic History   Marital status: Married    Spouse name: Not on file   Number of children: Not on file   Years of education: Not on file   Highest education level: Not on file   Occupational History   Not on file  Tobacco Use   Smoking status: Never   Smokeless tobacco: Not on file  Substance and Sexual Activity   Alcohol use: No   Drug use: No   Sexual activity: Not Currently  Other Topics Concern   Not on file  Social History Narrative   Not on file   Social Drivers of Health   Financial Resource Strain: Not on file  Food Insecurity: No Food Insecurity (07/27/2024)   Hunger Vital Sign    Worried About Running Out of Food in the Last Year: Never true    Ran Out of Food in the Last Year: Never true  Transportation Needs: No Transportation Needs (07/27/2024)   PRAPARE - Administrator, Civil Service (Medical): No    Lack of Transportation (Non-Medical): No  Physical Activity: Not on file  Stress: Not on file  Social Connections: Unknown (03/31/2022)   Received from [facility]   Social Network    Social Network: Not on file  Intimate Partner Violence: Not At Risk (07/27/2024)   Humiliation, Afraid, Rape, and Kick questionnaire    Fear of Current or Ex-Partner: No    Emotionally Abused: No    Physically Abused: No    Sexually Abused: No   ROS     Objective    BP 123/73   Pulse 70   Ht 5' 3 (1.6 m)   Wt 166 lb (75.3 kg)   SpO2 94%   BMI 29.41 kg/m   Physical Exam Vitals and nursing note reviewed.  Constitutional:      Appearance: Normal appearance.  HENT:     Head: Normocephalic.     Mouth/Throat:     Mouth: Mucous membranes are moist.     Pharynx: Oropharynx is clear.  Eyes:     Extraocular Movements: Extraocular movements intact.     Pupils: Pupils are equal, round, and reactive to light.  Cardiovascular:     Rate and Rhythm: Normal rate and regular rhythm.  Pulmonary:     Effort: Pulmonary effort is normal.     Breath sounds: Normal breath sounds.  Musculoskeletal:     Cervical back: Normal range of motion and neck supple.  Skin:    General: Skin is warm and dry.  Neurological:     Mental Status: She is  alert and oriented to person, place, and time.     Gait: Gait abnormal (antalgic, ambulates with cane).  Psychiatric:        Mood and Affect: Mood normal.        Thought Content: Thought content normal.         Assessment & Plan:   Problem List Items Addressed This Visit       Cardiovascular and Mediastinum   Hypertension associated with diabetes (HCC) (Chronic)   Blood pressure well-controlled with current medications.  No medication changes made today.  No refills needed at this time.  Continue with low-sodium diet  and regular physical activity.      Relevant Medications   atorvastatin (LIPITOR) 10 MG tablet   glimepiride (AMARYL) 2 MG tablet   lisinopril (ZESTRIL) 2.5 MG tablet     Endocrine   Type 2 diabetes mellitus with diabetic neuropathy, unspecified (HCC) (Chronic)   She reports having lab work on in May 2025 by her previous PCP, Dr. Bertell.  We will obtain these records from their office.  She reports good control of diabetes.  No refills or medication changes made at this time..      Relevant Medications   atorvastatin (LIPITOR) 10 MG tablet   glimepiride (AMARYL) 2 MG tablet   lisinopril (ZESTRIL) 2.5 MG tablet     Other   Anxiety disorder (Chronic)   Anxiety managed with Xanax 0.25 mg. Stressors include recent bereavements and neighborhood disturbances.  She does not need refills at this time.  Agreed to refill as needed.      Relevant Medications   ALPRAZolam (XANAX) 0.25 MG tablet   BMI 29.0-29.9,adult (Chronic)   Recommend low carbohydrate diet regular physical activity to achieve weight loss and maintain good control of blood sugar levels.      Other Visit Diagnoses       Immunization due    -  Primary   Relevant Orders   Flu vaccine HIGH DOSE PF(Fluzone Trivalent) (Completed)           Return in about 6 months (around 01/24/2025) for chronic follow-up with PCP.   Leita Longs, FNP

## 2024-07-27 NOTE — Assessment & Plan Note (Signed)
 She reports having lab work on in May 2025 by her previous PCP, Dr. Bertell.  We will obtain these records from their office.  She reports good control of diabetes.  No refills or medication changes made at this time.SABRA

## 2024-07-27 NOTE — Assessment & Plan Note (Signed)
 Anxiety managed with Xanax 0.25 mg. Stressors include recent bereavements and neighborhood disturbances.  She does not need refills at this time.  Agreed to refill as needed.

## 2024-07-27 NOTE — Assessment & Plan Note (Signed)
 Blood pressure well-controlled with current medications.  No medication changes made today.  No refills needed at this time.  Continue with low-sodium diet and regular physical activity.

## 2024-07-27 NOTE — Assessment & Plan Note (Signed)
 Recommend low carbohydrate diet regular physical activity to achieve weight loss and maintain good control of blood sugar levels.

## 2024-08-21 ENCOUNTER — Telehealth: Payer: Self-pay

## 2024-08-21 NOTE — Telephone Encounter (Signed)
 Prescription Request  08/21/2024  LOV: Visit date not found  What is the name of the medication or equipment? gabapentin (NEURONTIN) 100 MG capsule   lisinopril (ZESTRIL) 2.5 MG tablet    Please note patient is requesting to be put on metoprolol tartrate (LOPRESSOR) 25 MG tablet   Have you contacted your pharmacy to request a refill? Yes   Which pharmacy would you like this sent to? [facility]    Patient notified that their request is being sent to the clinical staff for review and that they should receive a response within 2 business days.   Please advise at Mobile [phone]

## 2024-08-22 ENCOUNTER — Other Ambulatory Visit: Payer: Self-pay

## 2024-08-22 DIAGNOSIS — E1140 Type 2 diabetes mellitus with diabetic neuropathy, unspecified: Secondary | ICD-10-CM

## 2024-08-22 DIAGNOSIS — E1159 Type 2 diabetes mellitus with other circulatory complications: Secondary | ICD-10-CM

## 2024-08-22 MED ORDER — METOPROLOL TARTRATE 25 MG PO TABS: 25.0000 mg | ORAL_TABLET | Freq: Two times a day (BID) | ORAL | 1 refills | Status: AC

## 2024-08-22 MED ORDER — GABAPENTIN 100 MG PO CAPS: 100.0000 mg | ORAL_CAPSULE | Freq: Three times a day (TID) | ORAL | 1 refills | Status: AC

## 2024-08-22 MED ORDER — LISINOPRIL 2.5 MG PO TABS: 2.5000 mg | ORAL_TABLET | Freq: Every day | ORAL | 1 refills | Status: AC

## 2024-08-23 NOTE — Telephone Encounter (Signed)
 Pt advised with verbal understanding

## 2024-09-25 ENCOUNTER — Other Ambulatory Visit: Payer: Self-pay

## 2024-10-02 ENCOUNTER — Ambulatory Visit (INDEPENDENT_AMBULATORY_CARE_PROVIDER_SITE_OTHER): Payer: Self-pay

## 2024-10-02 VITALS — BP 133/76 | HR 80 | Ht 63.0 in | Wt 166.0 lb

## 2024-10-02 DIAGNOSIS — E1140 Type 2 diabetes mellitus with diabetic neuropathy, unspecified: Secondary | ICD-10-CM | POA: Diagnosis not present

## 2024-10-02 DIAGNOSIS — N1831 Chronic kidney disease, stage 3a: Secondary | ICD-10-CM

## 2024-10-02 DIAGNOSIS — E785 Hyperlipidemia, unspecified: Secondary | ICD-10-CM | POA: Diagnosis not present

## 2024-10-02 DIAGNOSIS — E1169 Type 2 diabetes mellitus with other specified complication: Secondary | ICD-10-CM | POA: Diagnosis not present

## 2024-10-02 NOTE — Progress Notes (Unsigned)
 Established Patient Office Visit  Subjective   Patient ID: Nina Grant, female    DOB: 23-Mar-1946  Age: 78 y.o. MRN: [id number]  Chief Complaint  Patient presents with   Medical Management of Chronic Issues    Pt here to discuss medications and chart information     HPI Discussed the use of AI scribe software for clinical note transcription with the patient, who gave verbal consent to proceed.  History of Present Illness    Nina Grant is a 78 year old female who presents with concerns about her chronic kidney disease and medication use.  Chronic kidney disease concerns - Concerned about diagnosis of stage 3 chronic kidney disease after reading a note from a previous physician - Unaware of the diagnosis prior to reading the note and unsure how long she has had the condition - Worried about disease progression and the potential need for dialysis in the future - Family history of kidney disease; mother had severe kidney problems  Peripheral neuropathy symptoms - Experiences neuropathic pain described as feet feeling like they are being stung by bees when walking - Similar neuropathic sensations present in legs and thighs, especially at night - Prescribed gabapentin for neuropathic symptoms - Previous physician attributed neuropathy to diabetes, though she was told she is only 'borderline diabetic'  Glycemic control and antidiabetic medication use - Currently taking glimepiride as a precaution for borderline diabetes - Monitors and maintains A1c levels within target range - Never required or used insulin therapy  Allergic rhinitis symptoms and medication effects - Uses a nasal spray intermittently, which she believes causes hoarseness - Increased sneezing and rhinorrhea over the past 1-2 weeks - Takes an allergy pill between 6 and 7 PM  Dietary habits - Attempts to maintain a balanced diet with vegetables - Occasionally consumes sweets  Cerebrovascular disease  history - History of stroke in 2009 - Cardiology evaluation at that time indicated no evidence of heart disease     Patient Active Problem List   Diagnosis Date Noted   Anxiety disorder 07/27/2024   Hyperlipidemia associated with type 2 diabetes mellitus (HCC) 07/27/2024   Type 2 diabetes mellitus with diabetic neuropathy, unspecified (HCC) 07/27/2024   Hypertension associated with diabetes (HCC) 07/27/2024   CKD (chronic kidney disease) stage 3, GFR 30-59 ml/min (HCC) 07/27/2024   BMI 29.0-29.9,adult 07/27/2024   Allergic rhinitis 07/27/2024   Colon polyps 03/29/2012   Family history of colon cancer 03/29/2012      ROS    Objective:     BP 133/76   Pulse 80   Ht 5' 3 (1.6 m)   Wt 166 lb (75.3 kg)   SpO2 97%   BMI 29.41 kg/m  BP Readings from Last 3 Encounters:  10/02/24 133/76  07/27/24 123/73  04/28/12 115/61   Wt Readings from Last 3 Encounters:  10/02/24 166 lb (75.3 kg)  07/27/24 166 lb (75.3 kg)  04/28/12 176 lb (79.8 kg)     Physical Exam Vitals and nursing note reviewed.  Constitutional:      Appearance: Normal appearance.  HENT:     Head: Normocephalic.     Right Ear: Tympanic membrane, ear canal and external ear normal.     Left Ear: Tympanic membrane, ear canal and external ear normal.     Nose: Nose normal.     Mouth/Throat:     Mouth: Mucous membranes are moist.     Pharynx: Oropharynx is clear.  Cardiovascular:     Rate  and Rhythm: Normal rate and regular rhythm.  Pulmonary:     Effort: Pulmonary effort is normal.     Breath sounds: Normal breath sounds.  Musculoskeletal:     Cervical back: Normal range of motion and neck supple.  Skin:    General: Skin is warm and dry.  Neurological:     Mental Status: She is alert and oriented to person, place, and time.  Psychiatric:        Mood and Affect: Mood normal.        Thought Content: Thought content normal.        The ASCVD Risk score (Arnett DK, et al., 2019) failed to calculate  for the following reasons:   Risk score cannot be calculated because patient has a medical history suggesting prior/existing ASCVD    Assessment & Plan:   Problem List Items Addressed This Visit       Endocrine   Hyperlipidemia associated with type 2 diabetes mellitus (HCC) (Chronic)   Managed with atorvastatin 10 mg.      Relevant Orders   CMP14+EGFR (Completed)   Type 2 diabetes mellitus with diabetic neuropathy, unspecified (HCC) - Primary (Chronic)   Confirmed type 2 diabetes and neuropathy. Reviewed neuropathic symptoms and gabapentin use. Discussed diabetes management and dietary adherence. - Ordered A1c test to assess diabetes control.      Relevant Orders   Urine Microalbumin w/creat. ratio (Completed)   HgB A1c (Completed)     Genitourinary   CKD (chronic kidney disease) stage 3, GFR 30-59 ml/min (HCC) (Chronic)   Discussed potential progression to stage 4 and dialysis. - Ordered blood work to assess kidney function.      Relevant Orders   CMP14+EGFR (Completed)   No follow-ups on file.    Leita Longs, FNP

## 2024-10-03 NOTE — Assessment & Plan Note (Signed)
 Managed with atorvastatin 10 mg

## 2024-10-03 NOTE — Assessment & Plan Note (Signed)
 Confirmed type 2 diabetes and neuropathy. Reviewed neuropathic symptoms and gabapentin use. Discussed diabetes management and dietary adherence. - Ordered A1c test to assess diabetes control.

## 2024-10-03 NOTE — Assessment & Plan Note (Signed)
 Discussed potential progression to stage 4 and dialysis. - Ordered blood work to assess kidney function.

## 2024-10-04 LAB — CMP14+EGFR
ALT: 31 IU/L (ref 0–32)
AST: 34 IU/L (ref 0–40)
Albumin: 4.5 g/dL (ref 3.8–4.8)
Alkaline Phosphatase: 66 IU/L (ref 49–135)
BUN/Creatinine Ratio: 11 — ABNORMAL LOW (ref 12–28)
BUN: 10 mg/dL (ref 8–27)
Bilirubin Total: 0.5 mg/dL (ref 0.0–1.2)
CO2: 25 mmol/L (ref 20–29)
Calcium: 9.9 mg/dL (ref 8.7–10.3)
Chloride: 103 mmol/L (ref 96–106)
Creatinine, Ser: 0.88 mg/dL (ref 0.57–1.00)
Globulin, Total: 2.4 g/dL (ref 1.5–4.5)
Glucose: 66 mg/dL — ABNORMAL LOW (ref 70–99)
Potassium: 4.8 mmol/L (ref 3.5–5.2)
Sodium: 141 mmol/L (ref 134–144)
Total Protein: 6.9 g/dL (ref 6.0–8.5)
eGFR: 67 mL/min/1.73 (ref >59)

## 2024-10-04 LAB — HEMOGLOBIN A1C
Est. average glucose Bld gHb Est-mCnc: 140 mg/dL
Hgb A1c MFr Bld: 6.5 % — ABNORMAL HIGH (ref 4.8–5.6)

## 2024-10-04 LAB — MICROALBUMIN / CREATININE URINE RATIO
Creatinine, Urine: 124.9 mg/dL
Microalb/Creat Ratio: 56 mg/g{creat} — AB (ref 0–29)
Microalbumin, Urine: 69.7 ug/mL

## 2024-10-24 ENCOUNTER — Ambulatory Visit: Payer: Self-pay

## 2024-10-24 ENCOUNTER — Telehealth: Payer: Self-pay

## 2024-10-24 NOTE — Telephone Encounter (Signed)
 Copied from CRM #[id number]. Topic: Clinical - Lab/Test Results >> Oct 24, 2024  8:25 AM Ahlexyia S wrote: Reason for CRM: Pt called in having questions about her blood work that was done on 11/17. Due to [facility] having 2 hour delay I was unable to speak to someone from [facility]. Informed pt that I will send message and someone will contact her when next available. Pt advised of turnaround time.

## 2024-11-07 ENCOUNTER — Other Ambulatory Visit: Payer: Self-pay

## 2024-11-14 ENCOUNTER — Other Ambulatory Visit: Payer: Self-pay

## 2024-11-24 ENCOUNTER — Other Ambulatory Visit: Payer: Self-pay

## 2024-11-24 DIAGNOSIS — J301 Allergic rhinitis due to pollen: Secondary | ICD-10-CM

## 2024-11-24 MED ORDER — MONTELUKAST SODIUM 10 MG PO TABS: 10.0000 mg | ORAL_TABLET | Freq: Every day | ORAL | 11 refills | Status: AC

## 2024-12-05 ENCOUNTER — Other Ambulatory Visit: Payer: Self-pay

## 2024-12-05 DIAGNOSIS — E1169 Type 2 diabetes mellitus with other specified complication: Secondary | ICD-10-CM

## 2024-12-05 MED ORDER — ATORVASTATIN CALCIUM 10 MG PO TABS: 10.0000 mg | ORAL_TABLET | Freq: Every day | ORAL | 3 refills | Status: AC

## 2025-01-08 ENCOUNTER — Ambulatory Visit
